# Patient Record
Sex: Female | Born: 1940 | Race: White | Hispanic: No | Smoking: Never smoker
Health system: Southern US, Community
[De-identification: ages and names within clinical notes are randomized; demographics above are authoritative.]

## PROBLEM LIST (undated history)

## (undated) DIAGNOSIS — R109 Unspecified abdominal pain: Secondary | ICD-10-CM

## (undated) DIAGNOSIS — M545 Low back pain: Secondary | ICD-10-CM

## (undated) DIAGNOSIS — R3989 Other symptoms and signs involving the genitourinary system: Secondary | ICD-10-CM

## (undated) DIAGNOSIS — N8111 Cystocele, midline: Secondary | ICD-10-CM

## (undated) DIAGNOSIS — H8110 Benign paroxysmal vertigo, unspecified ear: Secondary | ICD-10-CM

## (undated) DIAGNOSIS — M7540 Impingement syndrome of unspecified shoulder: Secondary | ICD-10-CM

## (undated) DIAGNOSIS — K219 Gastro-esophageal reflux disease without esophagitis: Secondary | ICD-10-CM

## (undated) DIAGNOSIS — IMO0002 Reserved for concepts with insufficient information to code with codable children: Secondary | ICD-10-CM

## (undated) DIAGNOSIS — H409 Unspecified glaucoma: Secondary | ICD-10-CM

## (undated) DIAGNOSIS — M1990 Unspecified osteoarthritis, unspecified site: Secondary | ICD-10-CM

## (undated) DIAGNOSIS — E7800 Pure hypercholesterolemia, unspecified: Secondary | ICD-10-CM

## (undated) DIAGNOSIS — N952 Postmenopausal atrophic vaginitis: Secondary | ICD-10-CM

## (undated) DIAGNOSIS — M7060 Trochanteric bursitis, unspecified hip: Secondary | ICD-10-CM

## (undated) DIAGNOSIS — C4359 Malignant melanoma of other part of trunk: Secondary | ICD-10-CM

## (undated) DIAGNOSIS — N3010 Interstitial cystitis (chronic) without hematuria: Secondary | ICD-10-CM

## (undated) DIAGNOSIS — I10 Essential (primary) hypertension: Secondary | ICD-10-CM

## (undated) HISTORY — DX: Malignant melanoma of other part of trunk: C43.59

## (undated) HISTORY — DX: Interstitial cystitis (chronic) without hematuria: N30.10

## (undated) HISTORY — DX: Reserved for concepts with insufficient information to code with codable children: IMO0002

## (undated) HISTORY — PX: MELANOMA EXCISION: SHX5266

## (undated) HISTORY — DX: Unspecified glaucoma: H40.9

## (undated) HISTORY — DX: Other symptoms and signs involving the genitourinary system: R39.89

## (undated) HISTORY — DX: Unspecified abdominal pain: R10.9

## (undated) HISTORY — DX: Cystocele, midline: N81.11

## (undated) HISTORY — DX: Essential (primary) hypertension: I10

## (undated) HISTORY — DX: Pure hypercholesterolemia, unspecified: E78.00

## (undated) HISTORY — DX: Low back pain: M54.5

## (undated) HISTORY — DX: Trochanteric bursitis, unspecified hip: M70.60

## (undated) HISTORY — DX: Benign paroxysmal vertigo, unspecified ear: H81.10

## (undated) HISTORY — DX: Postmenopausal atrophic vaginitis: N95.2

## (undated) HISTORY — PX: BREAST BIOPSY: SHX20

## (undated) HISTORY — DX: Impingement syndrome of unspecified shoulder: M75.40

## (undated) HISTORY — PX: RADICAL HYSTERECTOMY: SHX2283

---

## 2005-06-16 ENCOUNTER — Ambulatory Visit: Payer: Self-pay

## 2006-02-12 ENCOUNTER — Ambulatory Visit: Payer: Self-pay | Admitting: Gastroenterology

## 2006-12-22 ENCOUNTER — Ambulatory Visit: Payer: Self-pay | Admitting: Family Medicine

## 2007-12-02 ENCOUNTER — Ambulatory Visit: Payer: Self-pay | Admitting: Gastroenterology

## 2008-02-28 ENCOUNTER — Ambulatory Visit: Payer: Self-pay | Admitting: Family Medicine

## 2008-05-11 ENCOUNTER — Ambulatory Visit: Payer: Self-pay | Admitting: Family Medicine

## 2009-10-03 ENCOUNTER — Ambulatory Visit: Payer: Self-pay | Admitting: Gastroenterology

## 2009-10-31 ENCOUNTER — Ambulatory Visit: Payer: Self-pay | Admitting: Gastroenterology

## 2012-07-14 ENCOUNTER — Ambulatory Visit: Payer: Self-pay

## 2014-01-04 ENCOUNTER — Ambulatory Visit: Payer: Self-pay

## 2014-01-04 LAB — URINALYSIS, COMPLETE
BILIRUBIN, UR: NEGATIVE
Glucose,UR: NEGATIVE mg/dL (ref 0–75)
Ketone: NEGATIVE
Leukocyte Esterase: NEGATIVE
NITRITE: NEGATIVE
PROTEIN: NEGATIVE
Ph: 7 (ref 4.5–8.0)
SPECIFIC GRAVITY: 1.005 (ref 1.003–1.030)
WBC UR: NONE SEEN /HPF (ref 0–5)

## 2014-01-05 LAB — URINE CULTURE

## 2014-04-25 DIAGNOSIS — C4359 Malignant melanoma of other part of trunk: Secondary | ICD-10-CM

## 2014-04-25 HISTORY — DX: Malignant melanoma of other part of trunk: C43.59

## 2014-05-02 DIAGNOSIS — M545 Low back pain, unspecified: Secondary | ICD-10-CM | POA: Insufficient documentation

## 2014-05-02 DIAGNOSIS — M7060 Trochanteric bursitis, unspecified hip: Secondary | ICD-10-CM

## 2014-05-02 HISTORY — DX: Low back pain, unspecified: M54.50

## 2014-05-02 HISTORY — DX: Trochanteric bursitis, unspecified hip: M70.60

## 2014-07-11 DIAGNOSIS — K219 Gastro-esophageal reflux disease without esophagitis: Secondary | ICD-10-CM | POA: Insufficient documentation

## 2014-07-11 DIAGNOSIS — K222 Esophageal obstruction: Secondary | ICD-10-CM | POA: Insufficient documentation

## 2014-07-11 DIAGNOSIS — I10 Essential (primary) hypertension: Secondary | ICD-10-CM

## 2014-07-11 DIAGNOSIS — Q393 Congenital stenosis and stricture of esophagus: Secondary | ICD-10-CM | POA: Insufficient documentation

## 2014-07-11 DIAGNOSIS — H409 Unspecified glaucoma: Secondary | ICD-10-CM

## 2014-07-11 DIAGNOSIS — H8110 Benign paroxysmal vertigo, unspecified ear: Secondary | ICD-10-CM | POA: Insufficient documentation

## 2014-07-11 DIAGNOSIS — E7800 Pure hypercholesterolemia, unspecified: Secondary | ICD-10-CM

## 2014-07-11 HISTORY — DX: Essential (primary) hypertension: I10

## 2014-07-11 HISTORY — DX: Unspecified glaucoma: H40.9

## 2014-07-11 HISTORY — DX: Pure hypercholesterolemia, unspecified: E78.00

## 2014-07-11 HISTORY — DX: Benign paroxysmal vertigo, unspecified ear: H81.10

## 2014-07-12 ENCOUNTER — Ambulatory Visit: Payer: Self-pay | Admitting: Unknown Physician Specialty

## 2014-07-13 ENCOUNTER — Ambulatory Visit: Payer: Self-pay | Admitting: Unknown Physician Specialty

## 2014-10-18 DIAGNOSIS — M7540 Impingement syndrome of unspecified shoulder: Secondary | ICD-10-CM

## 2014-10-18 HISTORY — DX: Impingement syndrome of unspecified shoulder: M75.40

## 2015-02-21 DIAGNOSIS — M546 Pain in thoracic spine: Secondary | ICD-10-CM | POA: Insufficient documentation

## 2015-03-29 ENCOUNTER — Other Ambulatory Visit: Payer: Self-pay | Admitting: Unknown Physician Specialty

## 2015-03-29 DIAGNOSIS — M5414 Radiculopathy, thoracic region: Secondary | ICD-10-CM

## 2015-04-04 ENCOUNTER — Other Ambulatory Visit: Payer: Self-pay

## 2015-04-05 ENCOUNTER — Ambulatory Visit: Admission: RE | Admit: 2015-04-05 | Payer: Self-pay | Source: Ambulatory Visit

## 2015-06-14 ENCOUNTER — Telehealth: Payer: Self-pay | Admitting: Obstetrics and Gynecology

## 2015-06-14 NOTE — Telephone Encounter (Signed)
Patient called this morning and is inquiring about a new medication for memory loss (Prevagen).  She would like to know if you know anything about it and if it would hurt her kidneys as it contains a lot of protein.  Please advise and have one of the nurses give her a call.

## 2015-06-17 NOTE — Telephone Encounter (Signed)
Please notify patient that I am not familiar with this supplement.  From what I have read the FDA has not categorized it as a drug but there can be some serious side effects related to it's use.  I cannot make the recommendation for or against is given that it has not been approved by the FDA.  She can try and ask her PCP if they are more familiar with it.

## 2015-06-17 NOTE — Telephone Encounter (Signed)
Spoke with pt in reference to [place]. Nurse made pt aware [facility] is not aware of this drug and FDA has not approved it, therefore we can not give a medical recommendation. Nurse advised pt to call PCP. Pt voiced understanding stating she would give them a call.

## 2015-07-04 ENCOUNTER — Encounter: Payer: Self-pay | Admitting: Obstetrics and Gynecology

## 2015-07-04 ENCOUNTER — Ambulatory Visit (INDEPENDENT_AMBULATORY_CARE_PROVIDER_SITE_OTHER): Payer: Medicare Other | Admitting: Obstetrics and Gynecology

## 2015-07-04 VITALS — BP 143/74 | HR 72 | Ht 65.0 in | Wt 153.6 lb

## 2015-07-04 DIAGNOSIS — B3731 Acute candidiasis of vulva and vagina: Secondary | ICD-10-CM

## 2015-07-04 DIAGNOSIS — N3020 Other chronic cystitis without hematuria: Secondary | ICD-10-CM

## 2015-07-04 DIAGNOSIS — N952 Postmenopausal atrophic vaginitis: Secondary | ICD-10-CM

## 2015-07-04 DIAGNOSIS — N3010 Interstitial cystitis (chronic) without hematuria: Secondary | ICD-10-CM | POA: Diagnosis not present

## 2015-07-04 DIAGNOSIS — B373 Candidiasis of vulva and vagina: Secondary | ICD-10-CM | POA: Diagnosis not present

## 2015-07-04 DIAGNOSIS — N8111 Cystocele, midline: Secondary | ICD-10-CM

## 2015-07-04 DIAGNOSIS — R3989 Other symptoms and signs involving the genitourinary system: Secondary | ICD-10-CM | POA: Insufficient documentation

## 2015-07-04 DIAGNOSIS — R350 Frequency of micturition: Secondary | ICD-10-CM

## 2015-07-04 DIAGNOSIS — R109 Unspecified abdominal pain: Secondary | ICD-10-CM

## 2015-07-04 HISTORY — DX: Unspecified abdominal pain: R10.9

## 2015-07-04 HISTORY — DX: Cystocele, midline: N81.11

## 2015-07-04 LAB — URINALYSIS, COMPLETE
Bilirubin, UA: NEGATIVE
Glucose, UA: NEGATIVE
Leukocytes, UA: NEGATIVE
NITRITE UA: NEGATIVE
Protein, UA: NEGATIVE
RBC UA: NEGATIVE
SPEC GRAV UA: 1.025 (ref 1.005–1.030)
UUROB: 0.2 mg/dL (ref 0.2–1.0)
pH, UA: 5.5 (ref 5.0–7.5)

## 2015-07-04 LAB — BLADDER SCAN AMB NON-IMAGING: Scan Result: 0

## 2015-07-04 MED ORDER — NYSTATIN-TRIAMCINOLONE 100000-0.1 UNIT/GM-% EX OINT: 1.0000 "application " | TOPICAL_OINTMENT | Freq: Two times a day (BID) | CUTANEOUS | Status: DC

## 2015-07-04 MED ORDER — SODIUM BICARBONATE 8.4 % IV SOLN
11.0000 mL | Freq: Once | INTRAVENOUS | Status: AC
  Administered 2015-07-04: 11 mL

## 2015-07-04 NOTE — Progress Notes (Signed)
Bladder Rescue Solution Instillation  Due to IC patient is present today for a Rescue Solution Treatment.  Patient was cleaned and prepped in a sterile fashion with betadine and lidocaine 2% jelly was instilled into the urethra.  A 14 FR catheter was inserted, urine return was noted 12 ml, urine was yellow in color.  Instilled a solution consisting of 69ml of Sodium Bicarb, 2 ml Lidocaine and 1 ml of Heparin. The catheter was then removed. Patient tolerated well, no complications were noted.   Performed by: [place]  Follow up/ Additional Notes: 5 days for next Rescue solution treatment

## 2015-07-04 NOTE — Progress Notes (Signed)
07/04/2015 12:23 PM   [place] Pelto Lobato 1940/11/24 [id number]  Referring provider: No referring provider defined for this encounter.  Chief Complaint  Patient presents with  . Follow-up    89mth IC, chronic cystitis    HPI: 1.  Interstitial Cystitis-  Follow up- patient is a 74 year old female with a history of interstitial cystitis last visit 01/3015. She experiences mild dysuria and frequency with flares. She responds well to administration of rescue solutions and behavioral modifications. She has not required any treatment since her last visit. She states that she went to her PCP recently with complaint of frequency. Urine was checked and was found to be negative for infection. Her PCP recommended she follow up with [place] for another rescue solution.  She does have a history of vaginal atrophy and cystocele. She is currently using both oral and vaginal estrogen replacement.  Patient is complaining of mild external vaginal irritation today. No vaginal discharge.  PMH: Past Medical History  Diagnosis Date  . Chronic interstitial cystitis   . Vaginal atrophy   . Bladder pain   . Cystocele   . Abdominal pain   . Essential (primary) hypertension 07/11/2014  . Benign paroxysmal positional nystagmus 07/11/2014  . Bursitis, trochanteric 05/02/2014  . Cystocele, midline 07/04/2015  . AP (abdominal pain) 07/04/2015  . Glaucoma 07/11/2014  . Hypercholesteremia 07/11/2014  . Impingement syndrome of shoulder 10/18/2014  . LBP (low back pain) 05/02/2014  . 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 Breast biopsy    . Radical hysterectomy      Home Medications:    Medication List       This list is accurate as of: 07/04/15 12:23 PM.  Always use your most recent med list.               acetaminophen 325 MG tablet  Commonly known as:  TYLENOL  Take by mouth.     benzonatate 200 MG capsule  Commonly known as:   TESSALON  Take by mouth.     cetirizine 10 MG tablet  Commonly known as:  ZYRTEC  Take by mouth.     latanoprost 0.005 % ophthalmic solution  Commonly known as:  XALATAN  1 drop at bedtime.     lisinopril-hydrochlorothiazide 20-25 MG per tablet  Commonly known as:  PRINZIDE,ZESTORETIC  Take by mouth.     nystatin-triamcinolone ointment  Commonly known as:  MYCOLOG  Apply 1 application topically 2 (two) times daily.     Omega-3 1000 MG Caps  Take by mouth.     oxybutynin 5 MG 24 hr tablet  Commonly known as:  DITROPAN-XL  Take by mouth.     pantoprazole 20 MG tablet  Commonly known as:  PROTONIX  Take 20 mg by mouth daily.     PREMARIN vaginal cream  Generic drug:  conjugated estrogens  PLACE 0.5G VAGINALLY ONCE DAILY.     URIBEL 118 MG Caps  Take 1 capsule by mouth 4 (four) times daily as needed.        Allergies:  Allergies  Allergen Reactions  . Azo [Phenazopyridine] Itching  . Colesevelam Hcl Diarrhea  . Ezetimibe Diarrhea  . Lansoprazole Diarrhea  . Statins Other (See Comments)    diarrhea   . Ace Inhibitors Rash    Other Reaction: INSOMNIA  . Doxycycline Rash    Other Reaction: OTHER REACTION  . Estradiol Rash    Other Reaction:  OTHER REACTION  . Nsaids Rash    Other Reaction: GI UPSET  . Risedronate Sodium Rash    Other Reaction: OTHER REACTION    Family History: Family History  Problem Relation Age of Onset  . Kidney cancer Neg Hx   . Bladder Cancer Neg Hx     Social History:  reports that she has never smoked. She does not have any smokeless tobacco history on file. She reports that she does not drink alcohol or use illicit drugs.  ROS: UROLOGY Frequent Urination?: Yes Hard to postpone urination?: No Burning/pain with urination?: No Get up at night to urinate?: Yes Leakage of urine?: No Urine stream starts and stops?: No Trouble starting stream?: No Do you have to strain to urinate?: No Blood in urine?: No Urinary tract  infection?: No Sexually transmitted disease?: No Injury to kidneys or bladder?: No Weak stream?: No Currently pregnant?: No Vaginal bleeding?: No Last menstrual period?: 1966  Gastrointestinal Nausea?: No Vomiting?: No Indigestion/heartburn?: Yes Diarrhea?: No Constipation?: No  Constitutional Fever: No Night sweats?: No Weight loss?: No Fatigue?: No  Skin Skin rash/lesions?: No Itching?: No  Eyes Blurred vision?: No Double vision?: No  Ears/Nose/Throat Sore throat?: No Sinus problems?: No  Hematologic/Lymphatic Swollen glands?: No Easy bruising?: No  Cardiovascular Leg swelling?: No Chest pain?: No  Respiratory Cough?: No Shortness of breath?: No  Endocrine Excessive thirst?: No  Musculoskeletal Back pain?: Yes Joint pain?: No  Neurological Headaches?: No Dizziness?: No  Psychologic Depression?: No Anxiety?: No  Physical Exam: BP 143/74 mmHg  Pulse 72  Ht 5\' 5"  (1.651 m)  Wt 153 lb 9.6 oz (69.673 kg)  BMI 25.56 kg/m2  Constitutional:  Alert and oriented, No acute distress. HEENT: [place] AT, moist mucus membranes.  Trachea midline, no masses. Cardiovascular: No clubbing, cyanosis, or edema. Respiratory: Normal respiratory , no increased work of breathing. GI: Abdomen is soft, nontender, nondistended, no abdominal masses GU: No CVA tenderness. External vaginal exam remarkable for bilateral labia majora irritation and mild tenderness, small amount of white discharge noted on labia minora Skin: No rashes, bruises or suspicious lesions. Lymph: No cervical or inguinal adenopathy. Neurologic: Grossly intact, no focal deficits, moving all 4 extremities. Psychiatric: Normal mood and affect.  Laboratory Data: No results found for: WBC, HGB, HCT, MCV, PLT  No results found for: CREATININE  No results found for: PSA  No results found for: TESTOSTERONE  No results found for: HGBA1C  Urinalysis Results for orders placed or performed in visit on  07/04/15  Urinalysis, Complete  Result Value Ref Range   Specific Gravity, UA 1.025 1.005 - 1.030   pH, UA 5.5 5.0 - 7.5   Color, UA Yellow Yellow   Appearance Ur Clear Clear   Leukocytes, UA Negative Negative   Protein, UA Negative Negative/Trace   Glucose, UA Negative Negative   Ketones, UA Trace (A) Negative   RBC, UA Negative Negative   Bilirubin, UA Negative Negative   Urobilinogen, Ur 0.2 0.2 - 1.0 mg/dL   Nitrite, UA Negative Negative   Assessment & Plan:  74 year old female with interstitial cystitis with infrequent flares requiring administration of rescue solutions and behavior modifications which manages her symptoms well.  1. IC (interstitial cystitis)- Urine dip unremarkable today. Patient was unable to provide enough urine for microscopic examination. Rescue solution given today per patient request.  Will repeat twice next week and reevaluate. Patient encouraged to increase fluid intake. - Urinalysis, Complete  2. Cystocele, midline- Currently asymptomatic. PVR check today 85mL.  3. Vaginal atrophy- Continue oral and vaginal estrogen replacement.   4.  Yeast vaginitis-  External vaginal irritation consistent with yeast dermatitis. We will prescribe topical nystatin.  Return for follow up Tuesday and Friday of next week for Rescue solution.  Herbert Moors, [facility] [address] [place], [place] [address] [phone]

## 2015-07-04 NOTE — Progress Notes (Signed)
PUFF:  How many times do you go to the bathroom during the day?- 11-14 How many times do you go to the bathroom at night? 3 If you get up at night to go to the bathroom, does it bother you? Mildly Are you currently sexually active? Yes Any pain with the sexual intercourse? Occasionally

## 2015-07-05 ENCOUNTER — Telehealth: Payer: Self-pay

## 2015-07-05 NOTE — Telephone Encounter (Signed)
Yes please prescribe just regular nystatin cream apply 2-3 times daily to external vagina 1 week.

## 2015-07-05 NOTE — Telephone Encounter (Signed)
Pt called stating cream that was called into [facility] pharmacy is $200. Pt states can not afford that. Is there a different cream? Please advise.

## 2015-07-09 ENCOUNTER — Ambulatory Visit (INDEPENDENT_AMBULATORY_CARE_PROVIDER_SITE_OTHER): Payer: Medicare Other | Admitting: Obstetrics and Gynecology

## 2015-07-09 ENCOUNTER — Encounter: Payer: Self-pay | Admitting: Obstetrics and Gynecology

## 2015-07-09 VITALS — BP 143/78 | HR 69 | Resp 16 | Ht 65.0 in | Wt 173.4 lb

## 2015-07-09 DIAGNOSIS — B373 Candidiasis of vulva and vagina: Secondary | ICD-10-CM

## 2015-07-09 DIAGNOSIS — N952 Postmenopausal atrophic vaginitis: Secondary | ICD-10-CM

## 2015-07-09 DIAGNOSIS — B3731 Acute candidiasis of vulva and vagina: Secondary | ICD-10-CM

## 2015-07-09 DIAGNOSIS — N3010 Interstitial cystitis (chronic) without hematuria: Secondary | ICD-10-CM | POA: Diagnosis not present

## 2015-07-09 LAB — MICROSCOPIC EXAMINATION: Bacteria, UA: NONE SEEN

## 2015-07-09 LAB — URINALYSIS, COMPLETE
BILIRUBIN UA: NEGATIVE
GLUCOSE, UA: NEGATIVE
KETONES UA: NEGATIVE
LEUKOCYTES UA: NEGATIVE
Nitrite, UA: NEGATIVE
PROTEIN UA: NEGATIVE
RBC UA: NEGATIVE
SPEC GRAV UA: 1.015 (ref 1.005–1.030)
Urobilinogen, Ur: 0.2 mg/dL (ref 0.2–1.0)
pH, UA: 7 (ref 5.0–7.5)

## 2015-07-09 MED ORDER — SODIUM BICARBONATE 8.4 % IV SOLN
11.0000 mL | Freq: Once | INTRAVENOUS | Status: AC
  Administered 2015-07-09: 11 mL

## 2015-07-09 MED ORDER — FLUCONAZOLE 150 MG PO TABS: 150.0000 mg | ORAL_TABLET | Freq: Once | ORAL | Status: DC

## 2015-07-09 MED ORDER — NYSTATIN 100000 UNIT/GM EX CREA: 1.0000 "application " | TOPICAL_CREAM | Freq: Two times a day (BID) | CUTANEOUS | Status: DC

## 2015-07-09 MED ORDER — LIDOCAINE HCL 2 % EX GEL
1.0000 "application " | Freq: Once | CUTANEOUS | Status: AC
  Administered 2015-07-09: 1 via URETHRAL

## 2015-07-09 NOTE — Telephone Encounter (Signed)
Patient notified, patient has already picked up cream from pharmacy

## 2015-07-09 NOTE — Progress Notes (Signed)
07/09/2015 9:32 AM   Sara Jackson [birth year] [id number]  Referring provider: Sherrin Daisy, MD [address] [place], [place] [address]  Chief Complaint  Patient presents with  . Cystitis    Rescue solution    HPI: Interstitial Cystitis- Follow up- Patient is a 74 year old female with a history of interstitial cystitis last visit 07/04/15. She experiences mild dysuria and frequency with flares. She responds well to administration of rescue solutions and behavioral modifications.  She states that she went to her PCP recently with complaint of frequency. Urine was checked and was found to be negative for infection. Her PCP recommended she follow up with [place] for another rescue solution.  Patient presents today for 2 of 3 planned rescue solutions today.  She states that last solution improved her symptoms slightly but that she is still experiencing vaginal discomfort because she was unable to afford Mycolog cream prescribed last visit.  She does have a history of vaginal atrophy and cystocele. She is currently using both oral and vaginal estrogen replacement.   PMH: Past Medical History  Diagnosis Date  . Chronic interstitial cystitis   . Vaginal atrophy   . Bladder pain   . Cystocele   . Abdominal pain   . Essential (primary) hypertension 07/11/2014  . Benign paroxysmal positional nystagmus 07/11/2014  . Bursitis, trochanteric 05/02/2014  . Cystocele, midline 07/04/2015  . AP (abdominal pain) 07/04/2015  . Glaucoma 07/11/2014  . Hypercholesteremia 07/11/2014  . Impingement syndrome of shoulder 10/18/2014  . LBP (low back pain) 05/02/2014  . 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 Breast biopsy    . Radical hysterectomy      Home Medications:    Medication List       This list is accurate as of: 07/09/15  9:32 AM.  Always use your most recent med list.               acetaminophen 325 MG  tablet  Commonly known as:  TYLENOL  Take by mouth.     fluconazole 150 MG tablet  Commonly known as:  DIFLUCAN  Take 1 tablet (150 mg total) by mouth once.     latanoprost 0.005 % ophthalmic solution  Commonly known as:  XALATAN  1 drop at bedtime.     lisinopril-hydrochlorothiazide 20-25 MG per tablet  Commonly known as:  PRINZIDE,ZESTORETIC  Take by mouth.     nystatin cream  Commonly known as:  MYCOSTATIN  Apply 1 application topically 2 (two) times daily.     nystatin-triamcinolone ointment  Commonly known as:  MYCOLOG  Apply 1 application topically 2 (two) times daily.     PREMARIN vaginal cream  Generic drug:  conjugated estrogens  PLACE 0.5G VAGINALLY ONCE DAILY.     URIBEL 118 MG Caps  Take 1 capsule by mouth 4 (four) times daily as needed.        Allergies:  Allergies  Allergen Reactions  . Azo [Phenazopyridine] Itching  . Colesevelam Hcl Diarrhea  . Ezetimibe Diarrhea  . Lansoprazole Diarrhea  . Statins Other (See Comments)    diarrhea   . Ace Inhibitors Rash    Other Reaction: INSOMNIA  . Doxycycline Rash    Other Reaction: OTHER REACTION  . Estradiol Rash    Other Reaction: OTHER REACTION  . Nsaids Rash    Other Reaction: GI UPSET  . Risedronate Sodium Rash    Other Reaction: OTHER REACTION  Family History: Family History  Problem Relation Age of Onset  . Kidney cancer Neg Hx   . Bladder Cancer Neg Hx     Social History:  reports that she has never smoked. She does not have any smokeless tobacco history on file. She reports that she does not drink alcohol or use illicit drugs.  ROS: Review of systems: A 10 point comprehensive review of systems was obtained and is negative unless otherwise stated in the history of present illness.  Physical Exam: BP 143/78 mmHg  Pulse 69  Resp 16  Ht 5\' 5"  (1.651 m)  Wt 173 lb 6.4 oz (78.654 kg)  BMI 28.86 kg/m2  Constitutional:  Alert and oriented, No acute distress. HEENT: [place] AT, moist mucus  membranes.  Trachea midline, no masses. Cardiovascular: No clubbing, cyanosis, or edema. Respiratory: Normal respiratory effort, no increased work of breathing. GI: Abdomen is soft, nontender, nondistended, no abdominal masses GU: No CVA tenderness. Skin: No rashes, bruises or suspicious lesions. Lymph: No cervical adenopathy. Neurologic: Grossly intact, no focal deficits, moving all 4 extremities. Psychiatric: Normal mood and affect.  Assessment & Plan:  1. Interstitial cystitis Microscopic UA unremarkable today. 2 of 3 rescue solution administered.  Patient to follow up in 2 days for 3rd.   - Urinalysis, Complete  2. Yeast Vaginitis- Nystatin cream and Diflucan prescribed today.  3. Vaginal Atrophy-  Continue Estrogen replacement. Problem List Items Addressed This Visit      Genitourinary   Vaginal atrophy   Yeast vaginitis   Relevant Medications   nystatin cream (MYCOSTATIN)   fluconazole (DIFLUCAN) 150 MG tablet    Other Visit Diagnoses    Interstitial cystitis    -  Primary    Relevant Medications    heparin-lidocaine-sod bicarb bladder irrigation (Bladder rescue for bladder instillation procedures) (Completed)    lidocaine (XYLOCAINE) 2 % jelly 1 application (Completed)    heparin-lidocaine-sod bicarb bladder irrigation (Bladder rescue for bladder instillation procedures) (Start on 07/09/2015  9:45 AM)    Other Relevant Orders    Urinalysis, Complete      Return in about 3 days (around 07/12/2015).  Herbert Moors, [facility] [address], [place], [place] [address] [phone]

## 2015-07-09 NOTE — Progress Notes (Signed)
Bladder Rescue Solution Instillation  Due to interstitial cystitis patient is present today for a Rescue Solution Treatment.  Patient was cleaned and prepped in a sterile fashion with betadine and lidocaine 2% jelly was instilled into the urethra.  A  14FR catheter was inserted, urine return was not noted due to complete void upon checking UA.  Instilled a solution consisting of 66ml of Sodium Bicarb, 2 ml Lidocaine and 1 ml of Heparin. The catheter was then removed. Patient tolerated well, no complications were noted.   Performed by: Lyndee Hensen CMA

## 2015-07-12 ENCOUNTER — Encounter: Payer: Self-pay | Admitting: *Deleted

## 2015-07-12 ENCOUNTER — Ambulatory Visit (INDEPENDENT_AMBULATORY_CARE_PROVIDER_SITE_OTHER): Payer: Medicare Other | Admitting: Obstetrics and Gynecology

## 2015-07-12 DIAGNOSIS — N3010 Interstitial cystitis (chronic) without hematuria: Secondary | ICD-10-CM | POA: Diagnosis not present

## 2015-07-12 DIAGNOSIS — N952 Postmenopausal atrophic vaginitis: Secondary | ICD-10-CM

## 2015-07-12 DIAGNOSIS — B3731 Acute candidiasis of vulva and vagina: Secondary | ICD-10-CM

## 2015-07-12 DIAGNOSIS — B373 Candidiasis of vulva and vagina: Secondary | ICD-10-CM

## 2015-07-12 LAB — URINALYSIS, COMPLETE
BILIRUBIN UA: NEGATIVE
GLUCOSE, UA: NEGATIVE
KETONES UA: NEGATIVE
Leukocytes, UA: NEGATIVE
Nitrite, UA: NEGATIVE
PROTEIN UA: NEGATIVE
RBC UA: NEGATIVE
SPEC GRAV UA: 1.02 (ref 1.005–1.030)
Urobilinogen, Ur: 0.2 mg/dL (ref 0.2–1.0)
pH, UA: 6 (ref 5.0–7.5)

## 2015-07-12 LAB — MICROSCOPIC EXAMINATION
EPITHELIAL CELLS (NON RENAL): NONE SEEN /HPF (ref 0–10)
RBC, UA: NONE SEEN /hpf (ref 0–?)

## 2015-07-12 MED ORDER — LIDOCAINE HCL 2 % EX GEL
1.0000 "application " | Freq: Once | CUTANEOUS | Status: AC
  Administered 2015-07-12: 1 via URETHRAL

## 2015-07-12 MED ORDER — SODIUM BICARBONATE 8.4 % IV SOLN
11.0000 mL | Freq: Once | INTRAVENOUS | Status: AC
  Administered 2015-07-12: 11 mL

## 2015-07-12 NOTE — Progress Notes (Signed)
Erroneous entry

## 2015-07-12 NOTE — Progress Notes (Signed)
Bladder Rescue Solution Instillation  Due to interstitial cystitis patient is present today for a Rescue Solution Treatment.  Patient was cleaned and prepped in a sterile fashion with betadine and lidocaine 2% jelly was instilled into the urethra.  A 14 FR catheter was inserted, urine return was noted 181ml, urine was clear in color.  Instilled a solution consisting of 79ml of Sodium Bicarb, 2 ml Lidocaine and 1 ml of Heparin. The catheter was then removed. Patient tolerated well, no complications were noted.   Performed by: Gwenevere Abbot CMA

## 2015-07-26 ENCOUNTER — Other Ambulatory Visit: Payer: Self-pay | Admitting: Obstetrics and Gynecology

## 2015-07-27 ENCOUNTER — Ambulatory Visit
Admission: EM | Admit: 2015-07-27 | Discharge: 2015-07-27 | Disposition: A | Discharge status: Home / Self Care | Payer: Medicare Other | Attending: Family Medicine | Admitting: Family Medicine

## 2015-07-27 ENCOUNTER — Encounter: Payer: Self-pay | Admitting: Gynecology

## 2015-07-27 DIAGNOSIS — I10 Essential (primary) hypertension: Secondary | ICD-10-CM | POA: Insufficient documentation

## 2015-07-27 DIAGNOSIS — R8290 Unspecified abnormal findings in urine: Secondary | ICD-10-CM | POA: Insufficient documentation

## 2015-07-27 LAB — URINALYSIS COMPLETE WITH MICROSCOPIC (ARMC ONLY)
Bilirubin Urine: NEGATIVE
Glucose, UA: NEGATIVE mg/dL
Ketones, ur: NEGATIVE mg/dL
LEUKOCYTES UA: NEGATIVE
NITRITE: NEGATIVE
PH: 7 (ref 5.0–8.0)
PROTEIN: NEGATIVE mg/dL
Specific Gravity, Urine: 1.015 (ref 1.005–1.030)
WBC UA: NONE SEEN WBC/hpf (ref <3)

## 2015-07-27 MED ORDER — CLONIDINE HCL 0.1 MG PO TABS
0.1000 mg | ORAL_TABLET | Freq: Every day | ORAL | Status: DC
  Administered 2015-07-27: 0.1 mg via ORAL

## 2015-07-27 NOTE — ED Notes (Signed)
Pt. Changed BP meds on 9/16 b/c BP was improving.  9/23 noticed elevated BP and noticed fluid retention and headache (in back on head), and describes "feeling funny".  Pt. Also states prone to UTI.

## 2015-07-27 NOTE — Discharge Instructions (Signed)
Hypertension °Hypertension, commonly called high blood pressure, is when the force of blood pumping through your arteries is too strong. Your arteries are the blood vessels that carry blood from your heart throughout your body. A blood pressure reading consists of a higher number over a lower number, such as 110/72. The higher number (systolic) is the pressure inside your arteries when your heart pumps. The lower number (diastolic) is the pressure inside your arteries when your heart relaxes. Ideally you want your blood pressure below 120/80. °Hypertension forces your heart to work harder to pump blood. Your arteries may become narrow or stiff. Having hypertension puts you at risk for heart disease, stroke, and other problems.  °RISK FACTORS °Some risk factors for high blood pressure are controllable. Others are not.  °Risk factors you cannot control include:  °· Race. You may be at higher risk if you are African American. °· Age. Risk increases with age. °· Gender. Men are at higher risk than women before age 45 years. After age 65, women are at higher risk than men. °Risk factors you can control include: °· Not getting enough exercise or physical activity. °· Being overweight. °· Getting too much fat, sugar, calories, or salt in your diet. °· Drinking too much alcohol. °SIGNS AND SYMPTOMS °Hypertension does not usually cause signs or symptoms. Extremely high blood pressure (hypertensive crisis) may cause headache, anxiety, shortness of breath, and nosebleed. °DIAGNOSIS  °To check if you have hypertension, your health care provider will measure your blood pressure while you are seated, with your arm held at the level of your heart. It should be measured at least twice using the same arm. Certain conditions can cause a difference in blood pressure between your right and left arms. A blood pressure reading that is higher than normal on one occasion does not mean that you need treatment. If one blood pressure reading  is high, ask your health care provider about having it checked again. °TREATMENT  °Treating high blood pressure includes making lifestyle changes and possibly taking medicine. Living a healthy lifestyle can help lower high blood pressure. You may need to change some of your habits. °Lifestyle changes may include: °· Following the DASH diet. This diet is high in fruits, vegetables, and whole grains. It is low in salt, red meat, and added sugars. °· Getting at least 2½ hours of brisk physical activity every week. °· Losing weight if necessary. °· Not smoking. °· Limiting alcoholic beverages. °· Learning ways to reduce stress. ° If lifestyle changes are not enough to get your blood pressure under control, your health care provider may prescribe medicine. You may need to take more than one. Work closely with your health care provider to understand the risks and benefits. °HOME CARE INSTRUCTIONS °· Have your blood pressure rechecked as directed by your health care provider.   °· Take medicines only as directed by your health care provider. Follow the directions carefully. Blood pressure medicines must be taken as prescribed. The medicine does not work as well when you skip doses. Skipping doses also puts you at risk for problems.   °· Do not smoke.   °· Monitor your blood pressure at home as directed by your health care provider.  °SEEK MEDICAL CARE IF:  °· You think you are having a reaction to medicines taken. °· You have recurrent headaches or feel dizzy. °· You have swelling in your ankles. °· You have trouble with your vision. °SEEK IMMEDIATE MEDICAL CARE IF: °· You develop a severe headache or confusion. °·   You have unusual weakness, numbness, or feel faint.  You have severe chest or abdominal pain.  You vomit repeatedly.  You have trouble breathing. MAKE SURE YOU:   Understand these instructions.  Will watch your condition.  Will get help right away if you are not doing well or get worse. Document  Released: 10/19/2005 Document Revised: 03/05/2014 Document Reviewed: 08/11/2013 [facility] Patient Information 2015 [place], [place]. This information is not intended to replace advice given to you by your health care provider. Make sure you discuss any questions you have with your health care provider.  Follow up with PCP on Monday

## 2015-07-27 NOTE — ED Provider Notes (Signed)
CSN: [id number]     Arrival date & time 07/27/15  0802 History   First MD Initiated Contact with Patient 07/27/15 [phone]     Chief Complaint  Patient presents with  . Hypertension  . Urinary Tract Infection   (Consider location/radiation/quality/duration/timing/severity/associated sxs/prior Treatment) HPI Comments: 74 yo female here with a concern of high blood pressure. States her hypertension medication was recently adjusted (dose reduced due to readings) but since yesterday her blood pressure has been high and has had a headache. Denies any chest pains or shortness of breath. Also would like to have urine checked for UTI as she's had them frequently.   Patient is a 74 y.o. female presenting with hypertension and urinary tract infection. The history is provided by the patient.  Hypertension This is a new problem.  Urinary Tract Infection   Past Medical History  Diagnosis Date  . Chronic interstitial cystitis   . Vaginal atrophy   . Bladder pain   . Cystocele   . Abdominal pain   . Essential (primary) hypertension 07/11/2014  . Benign paroxysmal positional nystagmus 07/11/2014  . Bursitis, trochanteric 05/02/2014  . Cystocele, midline 07/04/2015  . AP (abdominal pain) 07/04/2015  . Glaucoma 07/11/2014  . Hypercholesteremia 07/11/2014  . Impingement syndrome of shoulder 10/18/2014  . LBP (low back pain) 05/02/2014  . 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 Breast biopsy Left   . Radical hysterectomy     Family History  Problem Relation Age of Onset  . Kidney cancer Neg Hx   . Bladder Cancer Neg Hx    Social History  Substance Use Topics  . Smoking status: Never Smoker   . Smokeless tobacco: None  . Alcohol Use: No   OB History    No data available     Review of Systems  Allergies  Azo; Colesevelam hcl; Ezetimibe; Lansoprazole; Statins; Ace inhibitors; Doxycycline; Estradiol; Nsaids; and Risedronate sodium  Home  Medications   Prior to Admission medications   Medication Sig Start Date End Date Taking? Authorizing Provider  acetaminophen (TYLENOL) 325 MG tablet Take by mouth. 07/11/14  Yes Historical Provider, MD  conjugated estrogens (PREMARIN) vaginal cream PLACE 0.5G VAGINALLY ONCE DAILY. 11/26/14  Yes Historical Provider, MD  latanoprost (XALATAN) 0.005 % ophthalmic solution 1 drop at bedtime.   Yes Historical Provider, MD  lisinopril (PRINIVIL,ZESTRIL) 10 MG tablet Take 10 mg by mouth daily.   Yes Historical Provider, MD  fluconazole (DIFLUCAN) 150 MG tablet Take 1 tablet (150 mg total) by mouth once. 07/09/15   Roda Shutters, FNP  lisinopril-hydrochlorothiazide (PRINZIDE,ZESTORETIC) 20-25 MG per tablet Take by mouth. 07/11/14   Historical Provider, MD  Meth-Hyo-M Barnett Hatter Phos-Ph Sal (URIBEL) 118 MG CAPS Take 1 capsule by mouth 4 (four) times daily as needed.    Historical Provider, MD  nystatin cream (MYCOSTATIN) Apply 1 application topically 2 (two) times daily. 07/09/15   Roda Shutters, FNP  nystatin-triamcinolone ointment [facility]) Apply 1 application topically 2 (two) times daily. 07/04/15   Roda Shutters, FNP   Meds Ordered and Administered this Visit   Medications - No data to display  BP 183/80 mmHg  Pulse 88  Temp(Src) 97.9 F (36.6 C) (Oral)  Ht 5\' 5"  (1.651 m)  Wt 157 lb 3.2 oz (71.305 kg)  BMI 26.16 kg/m2  SpO2 97% No data found.   Physical Exam  Constitutional: She is oriented to person, place, and time. She appears well-developed and well-nourished.  No distress.  Cardiovascular: Normal rate, normal heart sounds and intact distal pulses.   No murmur heard. Pulmonary/Chest: Effort normal and breath sounds normal. No respiratory distress. She has no wheezes. She has no rales. She exhibits no tenderness.  Abdominal: Soft. Bowel sounds are normal. She exhibits no distension and no mass. There is no tenderness. There is no rebound and no guarding.  Neurological: She is oriented  to person, place, and time. No cranial nerve deficit.  Skin: No rash noted. She is not diaphoretic.  Nursing note and vitals reviewed.   ED Course  Procedures (including critical care time)  Labs Review Labs Reviewed  URINALYSIS COMPLETEWITH MICROSCOPIC ([place] ONLY) - Abnormal; Notable for the following:    Color, Urine STRAW (*)    Hgb urine dipstick TRACE (*)    Squamous Epithelial / LPF 6-30 (*)    All other components within normal limits  URINE CULTURE    Imaging Review No results found.   Visual Acuity Review  Right Eye Distance:   Left Eye Distance:   Bilateral Distance:    Right Eye Near:   Left Eye Near:    Bilateral Near:         MDM   1. Essential hypertension    Discharge Medication List as of 07/27/2015  9:32 AM    Plan: 1.  UA results reviewed and diagnosis reviewed with patient 2. Patient given clonidine 0.1mg  po x 1 in clinic with improvement of bp; recheck manual bp: 150/80 3. Recommend increase lisinopril from 10mg  qd to 20mg  qd today and tomorrow (Sunday) 4. F/u with PCP on Monday for further recommendation on her HTN treatment/medications  Norval Gable, MD 07/27/15 [phone]

## 2015-07-29 LAB — URINE CULTURE

## 2015-08-02 ENCOUNTER — Ambulatory Visit: Payer: Self-pay

## 2015-08-02 ENCOUNTER — Telehealth: Payer: Self-pay

## 2015-08-02 NOTE — Telephone Encounter (Signed)
Pt pharmacy sent a refill request for nystatin cream. Please advise.  

## 2015-08-05 NOTE — Telephone Encounter (Signed)
Sometimes these are sent automatically. Using call the patient and see if she actually needs a refill. If she does be consented into the pharmacy. Thanks

## 2015-08-07 NOTE — Telephone Encounter (Signed)
Spoke with patient and she states she can get by for now and she will call office if and when she needs more.

## 2015-08-13 ENCOUNTER — Telehealth: Payer: Self-pay

## 2015-08-13 NOTE — Telephone Encounter (Signed)
Pt pharmacy sent a refill request for nystatin cream. Nurse spoke with pt who stated at this point in time she does not need the cream. Nurse reinforced with pt that when she does need more she will need a f/u appt. Pt voiced understanding.

## 2015-10-03 ENCOUNTER — Other Ambulatory Visit: Payer: Self-pay | Admitting: Otolaryngology

## 2015-10-03 DIAGNOSIS — R42 Dizziness and giddiness: Secondary | ICD-10-CM

## 2015-10-23 ENCOUNTER — Ambulatory Visit
Admission: RE | Admit: 2015-10-23 | Discharge: 2015-10-23 | Disposition: A | Discharge status: Home / Self Care | Payer: Medicare Other | Source: Ambulatory Visit | Attending: Otolaryngology | Admitting: Otolaryngology

## 2015-10-23 DIAGNOSIS — R42 Dizziness and giddiness: Secondary | ICD-10-CM | POA: Diagnosis present

## 2015-10-23 DIAGNOSIS — I6529 Occlusion and stenosis of unspecified carotid artery: Secondary | ICD-10-CM | POA: Diagnosis not present

## 2015-10-23 MED ORDER — GADOBENATE DIMEGLUMINE 529 MG/ML IV SOLN
15.0000 mL | Freq: Once | INTRAVENOUS | Status: AC | PRN
  Administered 2015-10-23: 14 mL via INTRAVENOUS

## 2015-10-30 ENCOUNTER — Encounter: Payer: Self-pay | Admitting: Obstetrics and Gynecology

## 2015-10-30 ENCOUNTER — Ambulatory Visit (INDEPENDENT_AMBULATORY_CARE_PROVIDER_SITE_OTHER): Payer: Medicare Other | Admitting: Obstetrics and Gynecology

## 2015-10-30 VITALS — BP 163/86 | HR 78 | Resp 16 | Ht 65.0 in | Wt 153.6 lb

## 2015-10-30 DIAGNOSIS — N3090 Cystitis, unspecified without hematuria: Secondary | ICD-10-CM

## 2015-10-30 LAB — MICROSCOPIC EXAMINATION
RBC MICROSCOPIC, UA: NONE SEEN /HPF (ref 0–?)
Renal Epithel, UA: NONE SEEN /hpf

## 2015-10-30 LAB — URINALYSIS, COMPLETE
Bilirubin, UA: NEGATIVE
GLUCOSE, UA: NEGATIVE
KETONES UA: NEGATIVE
Leukocytes, UA: NEGATIVE
NITRITE UA: POSITIVE — AB
PROTEIN UA: NEGATIVE
RBC, UA: NEGATIVE
SPEC GRAV UA: 1.015 (ref 1.005–1.030)
UUROB: 0.2 mg/dL (ref 0.2–1.0)
pH, UA: 6 (ref 5.0–7.5)

## 2015-10-30 MED ORDER — SODIUM BICARBONATE 8.4 % IV SOLN: 11.0000 mL | Freq: Once | INTRAVENOUS | Status: DC

## 2015-10-30 NOTE — Patient Instructions (Signed)
Uribel Samples- take 1 tablet up to 4 times daily with a large glass of water

## 2015-10-30 NOTE — Progress Notes (Signed)
3:49 PM   Sara Jackson 27-Aug-1941 [id number]  Referring provider: Sherrin Daisy, MD [place], [place] [id number]  Chief Complaint  Patient presents with  . Cystitis    Rescue solution    HPI: Interstitial Cystitis- Follow up- Patient is a 74 year old female with a history of interstitial cystitis last visit 07/04/15. She experiences mild dysuria and frequency with flares. She responds well to administration of rescue solutions and behavioral modifications.  She states that she went to her PCP recently with complaint of frequency. Urine was checked and was found to be negative for infection. Her PCP recommended she follow up with [place] for another rescue solution.  Patient presents today for 2 of 3 planned rescue solutions today.  She states that last solution improved her symptoms slightly but that she is still experiencing vaginal discomfort because she was unable to afford Mycolog cream prescribed last visit.  She does have a history of vaginal atrophy and cystocele. She is currently using both oral and vaginal estrogen replacement.   Current Status: Patient reports increased urinary frequency, left lower quadrant and suprapubic discomfort over the last few days.   No fevers, nausea or vomiting. No gross hematuria.  Took OTC AZO which helped some. No exacerbating or alleviating factors.  PMH: Past Medical History  Diagnosis Date  . Chronic interstitial cystitis   . Vaginal atrophy   . Bladder pain   . Cystocele   . Abdominal pain   . Essential (primary) hypertension 07/11/2014  . Benign paroxysmal positional nystagmus 07/11/2014  . Bursitis, trochanteric 05/02/2014  . Cystocele, midline 07/04/2015  . AP (abdominal pain) 07/04/2015  . Glaucoma 07/11/2014  . Hypercholesteremia 07/11/2014  . Impingement syndrome of shoulder 10/18/2014  . LBP (low back pain) 05/02/2014  . 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[place]) 04/25/2014    Surgical History: Past Surgical History    Procedure Laterality Date  . Breast biopsy Left   . Radical hysterectomy      Home Medications:    Medication List       This list is accurate as of: 10/30/15  3:49 PM.  Always use your most recent med list.               acetaminophen 325 MG tablet  Commonly known as:  TYLENOL  Take by mouth.     fluconazole 150 MG tablet  Commonly known as:  DIFLUCAN  Take 1 tablet (150 mg total) by mouth once.     latanoprost 0.005 % ophthalmic solution  Commonly known as:  XALATAN  1 drop at bedtime.     lisinopril 10 MG tablet  Commonly known as:  PRINIVIL,ZESTRIL  Take 10 mg by mouth daily.     lisinopril-hydrochlorothiazide 20-25 MG tablet  Commonly known as:  PRINZIDE,ZESTORETIC  Take by mouth.     meclizine 25 MG tablet  Commonly known as:  ANTIVERT  TAKE 1 TABLET BY MOUTH THREE TIMES DAILY AS NEEDED FOR DIZZINESS     nystatin cream  Commonly known as:  MYCOSTATIN  Apply 1 application topically 2 (two) times daily.     nystatin-triamcinolone ointment  Commonly known as:  MYCOLOG  Apply 1 application topically 2 (two) times daily.     PREMARIN vaginal cream  Generic drug:  conjugated estrogens  PLACE 0.5G VAGINALLY ONCE DAILY.     URIBEL 118 MG Caps  Take 1 capsule by mouth 4 (four) times daily as needed.        Allergies:  Allergies  Allergen Reactions  . Azo [Phenazopyridine] Itching  . Colesevelam Hcl Diarrhea  . Ezetimibe Diarrhea  . Fluoxetine Other (See Comments)    Other Reaction: INSOMNIA  . Lansoprazole Diarrhea  . Statins Other (See Comments)    diarrhea   . Ace Inhibitors Rash    Other Reaction: INSOMNIA  . Doxycycline Rash    Other Reaction: OTHER REACTION  . Estradiol Rash    Other Reaction: OTHER REACTION  . Nsaids Rash    Other Reaction: GI UPSET  . Risedronate Sodium Rash    Other Reaction: OTHER REACTION    Family History: Family History  Problem Relation Age of Onset  . Kidney cancer Neg Hx   . Bladder Cancer Neg Hx      Social History:  reports that she has never smoked. She does not have any smokeless tobacco history on file. She reports that she does not drink alcohol or use illicit drugs.  ROS: Review of systems: A 10 point comprehensive review of systems was obtained and is negative unless otherwise stated in the history of present illness.  Physical Exam: BP 163/86 mmHg  Pulse 78  Resp 16  Ht 5\' 5"  (1.651 m)  Wt 153 lb 9.6 oz (69.673 kg)  BMI 25.56 kg/m2  Constitutional:  Alert and oriented, No acute distress. HEENT: [place] AT, moist mucus membranes.  Trachea midline, no masses. Cardiovascular: No clubbing, cyanosis, or edema. Respiratory: Normal respiratory effort, no increased work of breathing. GI: Abdomen is soft, mild LLQ/suprapubic tenderness, nondistended, no abdominal masses GU: No CVA tenderness. Skin: No rashes, bruises or suspicious lesions. Lymph: No cervical adenopathy. Neurologic: Grossly intact, no focal deficits, moving all 4 extremities. Psychiatric: Normal mood and affect.  Assessment & Plan:  1. Interstitial cystitis Microscopic UA Nitrite positive but otherwise unremarkable. Possible UTI.  Will treat pending culture results. Patient provided samples of Uribel for discomfort.   - Urinalysis, Complete  2. Vaginal Atrophy-  Continue Estrogen replacement.  No Follow-up on file.  Herbert Moors, [facility] [address], [place], [place] [address] [phone]

## 2015-11-03 LAB — CULTURE, URINE COMPREHENSIVE

## 2015-11-05 ENCOUNTER — Ambulatory Visit: Payer: No Typology Code available for payment source | Admitting: Obstetrics and Gynecology

## 2015-11-05 ENCOUNTER — Telehealth: Payer: Self-pay

## 2015-11-05 ENCOUNTER — Telehealth: Payer: Self-pay | Admitting: Obstetrics and Gynecology

## 2015-11-05 NOTE — Telephone Encounter (Signed)
Spoke with pt in reference to -ucx. Pt voiced understanding.  

## 2015-11-05 NOTE — Telephone Encounter (Signed)
See previous note

## 2015-11-05 NOTE — Telephone Encounter (Signed)
Pt called asking for her urine culture results, please call.  If medicine is needed, please send to [facility] in [place].

## 2015-11-05 NOTE — Telephone Encounter (Signed)
-----   Message from Roda Shutters, [place] sent at 11/05/2015  1:00 PM EST ----- Please notify patient that her urine culture was negative for infection.

## 2015-11-19 ENCOUNTER — Telehealth: Payer: Self-pay | Admitting: Obstetrics and Gynecology

## 2015-11-19 DIAGNOSIS — R300 Dysuria: Secondary | ICD-10-CM

## 2015-11-19 NOTE — Telephone Encounter (Signed)
Pt was gvien samples of Uribel and states that this medicine is helping her.  She wants to have a prescription called in to her pharmacy, [facility] in [place].  Please contact pt and let her know if a prescription can be sent in.

## 2015-11-20 MED ORDER — URIBEL 118 MG PO CAPS: 1.0000 | ORAL_CAPSULE | Freq: Four times a day (QID) | ORAL | Status: DC | PRN

## 2015-11-20 NOTE — Telephone Encounter (Signed)
Medication sent to pt pharmacy 

## 2015-11-20 NOTE — Telephone Encounter (Signed)
Please send in a prescription.  thanks

## 2016-01-17 ENCOUNTER — Other Ambulatory Visit: Payer: Self-pay | Admitting: Family Medicine

## 2016-01-17 DIAGNOSIS — R1032 Left lower quadrant pain: Secondary | ICD-10-CM

## 2016-01-21 ENCOUNTER — Ambulatory Visit
Admission: RE | Admit: 2016-01-21 | Discharge: 2016-01-21 | Disposition: A | Discharge status: Home / Self Care | Payer: Medicare Other | Source: Ambulatory Visit | Attending: Family Medicine | Admitting: Family Medicine

## 2016-01-21 DIAGNOSIS — R1032 Left lower quadrant pain: Secondary | ICD-10-CM | POA: Diagnosis not present

## 2016-03-06 ENCOUNTER — Other Ambulatory Visit: Payer: Self-pay

## 2016-03-09 ENCOUNTER — Encounter: Payer: Self-pay | Admitting: *Deleted

## 2016-03-10 NOTE — Discharge Instructions (Signed)

## 2016-03-12 ENCOUNTER — Ambulatory Visit: Payer: Medicare Other | Admitting: Anesthesiology

## 2016-03-12 ENCOUNTER — Ambulatory Visit
Admission: RE | Admit: 2016-03-12 | Discharge: 2016-03-12 | Disposition: A | Discharge status: Home / Self Care | Payer: Medicare Other | Source: Ambulatory Visit | Attending: Gastroenterology | Admitting: Gastroenterology

## 2016-03-12 ENCOUNTER — Encounter
Admission: RE | Disposition: A | Discharge status: Home / Self Care | Payer: Self-pay | Source: Ambulatory Visit | Attending: Gastroenterology

## 2016-03-12 DIAGNOSIS — H409 Unspecified glaucoma: Secondary | ICD-10-CM | POA: Diagnosis not present

## 2016-03-12 DIAGNOSIS — E7800 Pure hypercholesterolemia, unspecified: Secondary | ICD-10-CM | POA: Insufficient documentation

## 2016-03-12 DIAGNOSIS — K219 Gastro-esophageal reflux disease without esophagitis: Secondary | ICD-10-CM | POA: Diagnosis not present

## 2016-03-12 DIAGNOSIS — Z79899 Other long term (current) drug therapy: Secondary | ICD-10-CM | POA: Diagnosis not present

## 2016-03-12 DIAGNOSIS — M160 Bilateral primary osteoarthritis of hip: Secondary | ICD-10-CM | POA: Insufficient documentation

## 2016-03-12 DIAGNOSIS — Z85820 Personal history of malignant melanoma of skin: Secondary | ICD-10-CM | POA: Diagnosis not present

## 2016-03-12 DIAGNOSIS — N3010 Interstitial cystitis (chronic) without hematuria: Secondary | ICD-10-CM | POA: Diagnosis not present

## 2016-03-12 DIAGNOSIS — M545 Low back pain: Secondary | ICD-10-CM | POA: Diagnosis not present

## 2016-03-12 DIAGNOSIS — I10 Essential (primary) hypertension: Secondary | ICD-10-CM | POA: Insufficient documentation

## 2016-03-12 DIAGNOSIS — M7060 Trochanteric bursitis, unspecified hip: Secondary | ICD-10-CM | POA: Insufficient documentation

## 2016-03-12 DIAGNOSIS — Z888 Allergy status to other drugs, medicaments and biological substances status: Secondary | ICD-10-CM | POA: Diagnosis not present

## 2016-03-12 DIAGNOSIS — K222 Esophageal obstruction: Secondary | ICD-10-CM | POA: Diagnosis not present

## 2016-03-12 DIAGNOSIS — Z90710 Acquired absence of both cervix and uterus: Secondary | ICD-10-CM | POA: Insufficient documentation

## 2016-03-12 DIAGNOSIS — H8110 Benign paroxysmal vertigo, unspecified ear: Secondary | ICD-10-CM | POA: Diagnosis not present

## 2016-03-12 DIAGNOSIS — R1310 Dysphagia, unspecified: Secondary | ICD-10-CM

## 2016-03-12 DIAGNOSIS — Z881 Allergy status to other antibiotic agents status: Secondary | ICD-10-CM | POA: Diagnosis not present

## 2016-03-12 DIAGNOSIS — K449 Diaphragmatic hernia without obstruction or gangrene: Secondary | ICD-10-CM

## 2016-03-12 HISTORY — DX: Unspecified osteoarthritis, unspecified site: M19.90

## 2016-03-12 HISTORY — DX: Gastro-esophageal reflux disease without esophagitis: K21.9

## 2016-03-12 HISTORY — PX: ESOPHAGOGASTRODUODENOSCOPY (EGD) WITH PROPOFOL: SHX5813

## 2016-03-12 SURGERY — ESOPHAGOGASTRODUODENOSCOPY (EGD) WITH PROPOFOL
Anesthesia: Monitor Anesthesia Care | Wound class: Clean Contaminated

## 2016-03-12 MED ORDER — LACTATED RINGERS IV SOLN: INTRAVENOUS | Status: DC

## 2016-03-12 MED ORDER — STERILE WATER FOR IRRIGATION IR SOLN
Status: DC | PRN
  Administered 2016-03-12: 08:00:00

## 2016-03-12 MED ORDER — LACTATED RINGERS IV SOLN
INTRAVENOUS | Status: DC
  Administered 2016-03-12: 07:00:00 via INTRAVENOUS

## 2016-03-12 MED ORDER — ACETAMINOPHEN 325 MG PO TABS: 325.0000 mg | ORAL_TABLET | ORAL | Status: DC | PRN

## 2016-03-12 MED ORDER — ACETAMINOPHEN 160 MG/5ML PO SOLN: 325.0000 mg | ORAL | Status: DC | PRN

## 2016-03-12 MED ORDER — PROPOFOL 10 MG/ML IV BOLUS
INTRAVENOUS | Status: DC | PRN
  Administered 2016-03-12 (×3): 20 mg via INTRAVENOUS

## 2016-03-12 MED ORDER — SODIUM CHLORIDE 0.9 % IV SOLN: INTRAVENOUS | Status: DC

## 2016-03-12 SURGICAL SUPPLY — 31 items
BALLN DILATOR 10-12 8 (BALLOONS)
BALLN DILATOR 12-15 8 (BALLOONS)
BALLN DILATOR 15-18 8 (BALLOONS) ×3
BALLN DILATOR CRE 0-12 8 (BALLOONS)
BALLN DILATOR ESOPH 8 10 CRE (MISCELLANEOUS) IMPLANT
BALLOON DILATOR 12-15 8 (BALLOONS) IMPLANT
BALLOON DILATOR 15-18 8 (BALLOONS) ×1 IMPLANT
BALLOON DILATOR CRE 0-12 8 (BALLOONS) IMPLANT
BLOCK BITE 60FR ADLT L/F GRN (MISCELLANEOUS) ×3 IMPLANT
CANISTER SUCT 1200ML W/VALVE (MISCELLANEOUS) ×3 IMPLANT
CLIP HMST 235XBRD CATH ROT (MISCELLANEOUS) IMPLANT
CLIP RESOLUTION 360 11X235 (MISCELLANEOUS)
FCP ESCP3.2XJMB 240X2.8X (MISCELLANEOUS)
FORCEPS BIOP RAD 4 LRG CAP 4 (CUTTING FORCEPS) IMPLANT
FORCEPS BIOP RJ4 240 W/NDL (MISCELLANEOUS)
FORCEPS ESCP3.2XJMB 240X2.8X (MISCELLANEOUS) IMPLANT
GOWN CVR UNV OPN BCK APRN NK (MISCELLANEOUS) ×2 IMPLANT
GOWN ISOL THUMB LOOP REG UNIV (MISCELLANEOUS) ×4
INJECTOR VARIJECT VIN23 (MISCELLANEOUS) IMPLANT
KIT DEFENDO VALVE AND CONN (KITS) IMPLANT
KIT ENDO PROCEDURE OLY (KITS) ×3 IMPLANT
MARKER SPOT ENDO TATTOO 5ML (MISCELLANEOUS) IMPLANT
PAD GROUND ADULT SPLIT (MISCELLANEOUS) IMPLANT
SNARE SHORT THROW 13M SML OVAL (MISCELLANEOUS) IMPLANT
SNARE SHORT THROW 30M LRG OVAL (MISCELLANEOUS) IMPLANT
SPOT EX ENDOSCOPIC TATTOO (MISCELLANEOUS)
SYR INFLATION 60ML (SYRINGE) ×3 IMPLANT
TRAP ETRAP POLY (MISCELLANEOUS) IMPLANT
VARIJECT INJECTOR VIN23 (MISCELLANEOUS)
WATER STERILE IRR 250ML POUR (IV SOLUTION) ×3 IMPLANT
WIRE CRE 18-20MM 8CM F G (MISCELLANEOUS) IMPLANT

## 2016-03-12 NOTE — Anesthesia Preprocedure Evaluation (Signed)
Anesthesia Evaluation  Patient identified by MRN, date of birth, ID band  Reviewed: Allergy & Precautions, H&P , NPO status , Patient's Chart, lab work & pertinent test results  Airway Mallampati: II  TM Distance: >3 FB Neck ROM: full    Dental no notable dental hx.    Pulmonary    Pulmonary exam normal       Cardiovascular hypertension, Rhythm:regular Rate:Normal     Neuro/Psych    GI/Hepatic GERD-  ,  Endo/Other    Renal/GU      Musculoskeletal   Abdominal   Peds  Hematology   Anesthesia Other Findings   Reproductive/Obstetrics                             Anesthesia Physical Anesthesia Plan  ASA: II  Anesthesia Plan: MAC   Post-op Pain Management:    Induction:   Airway Management Planned:   Additional Equipment:   Intra-op Plan:   Post-operative Plan:   Informed Consent: I have reviewed the patients History and Physical, chart, labs and discussed the procedure including the risks, benefits and alternatives for the proposed anesthesia with the patient or authorized representative who has indicated his/her understanding and acceptance.     Plan Discussed with: CRNA  Anesthesia Plan Comments:         Anesthesia Quick Evaluation  

## 2016-03-12 NOTE — Anesthesia Postprocedure Evaluation (Signed)
Anesthesia Post Note  Patient: Sara Jackson  Procedure(s) Performed: Procedure(s) (LRB): ESOPHAGOGASTRODUODENOSCOPY (EGD) WITH PROPOFOL with dilation (N/A)  Patient location during evaluation: PACU Anesthesia Type: MAC Level of consciousness: awake and alert and oriented Pain management: satisfactory to patient Vital Signs Assessment: post-procedure vital signs reviewed and stable Respiratory status: spontaneous breathing, nonlabored ventilation and respiratory function stable Cardiovascular status: blood pressure returned to baseline and stable Postop Assessment: Adequate PO intake and No signs of nausea or vomiting Anesthetic complications: no    Raliegh Ip

## 2016-03-12 NOTE — H&P (Signed)
[facility]  [address], [place], [place] [address] Phone: [phone] Fax : [phone]  Primary Care Physician:  Sherrin Daisy, MD Primary Gastroenterologist:  Dr. Allen Norris  Pre-Procedure History & Physical: HPI:  Sara Jackson is a 75 y.o. female is here for an endoscopy.   Past Medical History  Diagnosis Date  . Chronic interstitial cystitis   . Vaginal atrophy   . Bladder pain   . Cystocele   . Abdominal pain   . Essential (primary) hypertension 07/11/2014  . Benign paroxysmal positional nystagmus 07/11/2014  . Bursitis, trochanteric 05/02/2014  . Cystocele, midline 07/04/2015  . AP (abdominal pain) 07/04/2015  . Glaucoma 07/11/2014  . Hypercholesteremia 07/11/2014  . Impingement syndrome of shoulder 10/18/2014  . LBP (low back pain) 05/02/2014  . 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[place]) 04/25/2014  . GERD (gastroesophageal reflux disease)   . Arthritis     hips, lower back    Past Surgical History  Procedure Laterality Date  . Breast biopsy Left   . Radical hysterectomy    . Melanoma excision      Prior to Admission medications   Medication Sig Start Date End Date Taking? Authorizing Provider  acetaminophen (TYLENOL) 325 MG tablet Take by mouth. 07/11/14  Yes Historical Provider, MD  Cholecalciferol (VITAMIN D PO) Take by mouth.   Yes Historical Provider, MD  conjugated estrogens (PREMARIN) vaginal cream PLACE 0.5G VAGINALLY ONCE DAILY. 11/26/14  Yes Historical Provider, MD  estrogens, conjugated, (PREMARIN) 0.625 MG tablet Take 0.625 mg by mouth daily. Takes 1/2 tab daily   Yes Historical Provider, MD  ibuprofen (ADVIL,MOTRIN) 200 MG tablet Take 200 mg by mouth every 6 (six) hours as needed.   Yes Historical Provider, MD  latanoprost (XALATAN) 0.005 % ophthalmic solution 1 drop at bedtime.   Yes Historical Provider, MD  lisinopril-hydrochlorothiazide (PRINZIDE,ZESTORETIC) 20-25 MG per tablet Take by mouth. Takes 1/2 tablet 07/11/14  Yes Historical Provider,  MD  pantoprazole (PROTONIX) 40 MG tablet Take 40 mg by mouth daily.   Yes Historical Provider, MD    Allergies as of 03/06/2016 - Review Complete 10/30/2015  Allergen Reaction Noted  . Azo [phenazopyridine] Itching 06/20/2015  . Colesevelam hcl Diarrhea 07/04/2015  . Ezetimibe Diarrhea 07/04/2015  . Fluoxetine Other (See Comments) 10/30/2015  . Lansoprazole Diarrhea 07/04/2015  . Statins Other (See Comments) 06/20/2015  . Ace inhibitors Rash 07/04/2015  . Doxycycline Rash 07/04/2015  . Estradiol Rash 07/04/2015  . Nsaids Rash 07/04/2015  . Risedronate sodium Rash 07/04/2015    Family History  Problem Relation Age of Onset  . Kidney cancer Neg Hx   . Bladder Cancer Neg Hx     Social History   Social History  . Marital Status: Married    Spouse Name: N/A  . Number of Children: N/A  . Years of Education: N/A   Occupational History  . Not on file.   Social History Main Topics  . Smoking status: Never Smoker   . Smokeless tobacco: Not on file  . Alcohol Use: No  . Drug Use: No  . Sexual Activity: Not on file   Other Topics Concern  . Not on file   Social History Narrative    Review of Systems: See HPI, otherwise negative ROS  Physical Exam: BP 152/60 mmHg  Pulse 87  Temp(Src) 98.1 F (36.7 C) (Temporal)  Resp 16  Ht 5\' 5"  (1.651 m)  Wt 154 lb (69.854 kg)  BMI 25.63 kg/m2  SpO2 97% General:  Alert,  pleasant and cooperative in NAD Head:  Normocephalic and atraumatic. Neck:  Supple; no masses or thyromegaly. Lungs:  Clear throughout to auscultation.    Heart:  Regular rate and rhythm. Abdomen:  Soft, nontender and nondistended. Normal bowel sounds, without guarding, and without rebound.   Neurologic:  Alert and  oriented x4;  grossly normal neurologically.  Impression/Plan: Sara Jackson is here for an endoscopy to be performed for dysphagia  Risks, benefits, limitations, and alternatives regarding  endoscopy have been reviewed with the  patient.  Questions have been answered.  All parties agreeable.   Lucilla Lame, MD  03/12/2016, 7:16 AM

## 2016-03-12 NOTE — Op Note (Signed)
[facility] Patient Name: Sara Jackson Procedure Date: 03/12/2016 7:30 AM MRN: [id number] Account #: [id number] Date of Birth: 1940/12/19 Admit Type: Outpatient Age: 75 Room: [facility] OR ROOM 01 Gender: Female Note Status: Finalized Procedure:            Upper GI endoscopy Indications:          Dysphagia Providers:            Lucilla Lame, MD Referring MD:         Shirline Frees (Referring MD) Medicines:            Propofol per Anesthesia Complications:        No immediate complications. Procedure:            Pre-Anesthesia Assessment:                       - Prior to the procedure, a History and Physical was                        performed, and patient medications and allergies were                        reviewed. The patient's tolerance of previous                        anesthesia was also reviewed. The risks and benefits of                        the procedure and the sedation options and risks were                        discussed with the patient. All questions were                        answered, and informed consent was obtained. Prior                        Anticoagulants: The patient has taken no previous                        anticoagulant or antiplatelet agents. ASA Grade                        Assessment: II - A patient with mild systemic disease.                        After reviewing the risks and benefits, the patient was                        deemed in satisfactory condition to undergo the                        procedure.                       After obtaining informed consent, the endoscope was                        passed under direct vision. Throughout the procedure,  the patient's blood pressure, pulse, and oxygen                        saturations were monitored continuously. The Olympus                        GIF H180J endoscope (S#: [id number]) was introduced                        through the  mouth, and advanced to the second part of                        duodenum. The upper GI endoscopy was accomplished                        without difficulty. The patient tolerated the procedure                        well. Findings:      A small hiatal hernia was present.      One mild benign-appearing, intrinsic stenosis was found at the       gastroesophageal junction. And was traversed. A TTS dilator was passed       through the scope. Dilation with a 15-16.5-18 mm balloon dilator was       performed to 18 mm.      The stomach was normal.      The examined duodenum was normal. Impression:           - Small hiatal hernia.                       - Benign-appearing esophageal stenosis. Dilated.                       - Normal stomach.                       - Normal examined duodenum.                       - No specimens collected. Recommendation:       - Repeat upper endoscopy PRN for retreatment. Procedure Code(s):    --- Professional ---                       [phone], Esophagogastroduodenoscopy, flexible, transoral;                        with transendoscopic balloon dilation of esophagus                        (less than 30 mm diameter) Diagnosis Code(s):    --- Professional ---                       R13.10, Dysphagia, unspecified                       K44.9, Diaphragmatic hernia without obstruction or                        gangrene  K22.2, Esophageal obstruction CPT copyright 2016 American Medical Association. All rights reserved. The codes documented in this report are preliminary and upon coder review may  be revised to meet current compliance requirements. Lucilla Lame, MD 03/12/2016 7:44:46 AM This report has been signed electronically. Number of Addenda: 0 Note Initiated On: 03/12/2016 7:30 AM Total Procedure Duration: 0 hours 6 minutes 21 seconds       [facility]

## 2016-03-12 NOTE — Transfer of Care (Signed)
Immediate Anesthesia Transfer of Care Note  Patient: Sara Jackson  Procedure(s) Performed: Procedure(s): ESOPHAGOGASTRODUODENOSCOPY (EGD) WITH PROPOFOL with dilation (N/A)  Patient Location: PACU  Anesthesia Type: MAC  Level of Consciousness: awake, alert  and patient cooperative  Airway and Oxygen Therapy: Patient Spontanous Breathing and Patient connected to supplemental oxygen  Post-op Assessment: Post-op Vital signs reviewed, Patient's Cardiovascular Status Stable, Respiratory Function Stable, Patent Airway and No signs of Nausea or vomiting  Post-op Vital Signs: Reviewed and stable  Complications: No apparent anesthesia complications

## 2016-03-12 NOTE — Anesthesia Procedure Notes (Signed)
Procedure Name: MAC Performed by: Briante Loveall Pre-anesthesia Checklist: Patient identified, Emergency Drugs available, Suction available, Timeout performed and Patient being monitored Patient Re-evaluated:Patient Re-evaluated prior to inductionOxygen Delivery Method: Nasal cannula Placement Confirmation: positive ETCO2     

## 2016-03-13 ENCOUNTER — Encounter: Payer: Self-pay | Admitting: Gastroenterology

## 2016-07-14 ENCOUNTER — Ambulatory Visit (INDEPENDENT_AMBULATORY_CARE_PROVIDER_SITE_OTHER): Payer: Medicare Other | Admitting: Urology

## 2016-07-14 ENCOUNTER — Encounter: Payer: Self-pay | Admitting: Urology

## 2016-07-14 VITALS — BP 155/77 | HR 90 | Ht 65.0 in | Wt 156.6 lb

## 2016-07-14 DIAGNOSIS — N3010 Interstitial cystitis (chronic) without hematuria: Secondary | ICD-10-CM

## 2016-07-14 DIAGNOSIS — R350 Frequency of micturition: Secondary | ICD-10-CM | POA: Diagnosis not present

## 2016-07-14 DIAGNOSIS — N952 Postmenopausal atrophic vaginitis: Secondary | ICD-10-CM

## 2016-07-14 DIAGNOSIS — R1012 Left upper quadrant pain: Secondary | ICD-10-CM

## 2016-07-14 DIAGNOSIS — R109 Unspecified abdominal pain: Principal | ICD-10-CM

## 2016-07-14 DIAGNOSIS — G8929 Other chronic pain: Secondary | ICD-10-CM

## 2016-07-14 LAB — URINALYSIS, COMPLETE
Bilirubin, UA: NEGATIVE
GLUCOSE, UA: NEGATIVE
Ketones, UA: NEGATIVE
LEUKOCYTES UA: NEGATIVE
Nitrite, UA: NEGATIVE
PH UA: 5.5 (ref 5.0–7.5)
PROTEIN UA: NEGATIVE
Specific Gravity, UA: 1.025 (ref 1.005–1.030)
Urobilinogen, Ur: 0.2 mg/dL (ref 0.2–1.0)

## 2016-07-14 LAB — MICROSCOPIC EXAMINATION: Epithelial Cells (non renal): >10 /hpf — AB (ref 0–10)

## 2016-07-14 MED ORDER — LIDOCAINE HCL 2 % EX GEL
1.0000 "application " | Freq: Once | CUTANEOUS | Status: AC
  Administered 2016-07-14: 1 via URETHRAL

## 2016-07-14 MED ORDER — SODIUM BICARBONATE 8.4 % IV SOLN
11.0000 mL | Freq: Once | INTRAVENOUS | Status: AC
  Administered 2016-07-14: 11 mL

## 2016-07-14 NOTE — Progress Notes (Signed)
07/14/2016 3:33 PM   Sara Jackson 1941/08/29 [id number]  Referring provider: Sherrin Daisy, MD Dwight Pembroke, [place] [id number]  Chief Complaint  Patient presents with  . Cystitis    Rescue Solution    HPI: Patient is a 75 year old Caucasian female with interstitial cystitis who presents today for a rescue solution.  Patient was last seen in 10/2015 for symptoms of mild dysuria and frequency.  She was scheduled to have a rescue solution at that time, but her UA was nitrite positive.  Rescue solution was held and urine send for culture.  The urine culture was negative and patient's symptoms abated.  She has been symptom free since that time until one week ago.  Now, she is having frequency, urgency and nocturia.  She denies fevers, chills, nausea and vomiting.  She is not having dysuria or hematuria.    Patient has been having left lower quadrant pain that radiates to the left flank area.  This has been present for several months.  She has been evaluated by her PCP and gynecology for this pain and no etiology has been discovered.  The pain lasts for several hours and will wake her up at night.  She has not realized anything that makes the pain worse or better.  She states it is a deep pain.  She does not have any constipation.  She is not having any difficulty with BM's or blood in her stool.    She does not have a history of nephrolithiasis or family history of nephrolithiasis.    Pelvic US completed on 01/21/2016 noted 1.3 x 2.1 x 1.3 cm cyst right ovary, most likely unchanged from prior MRI.   Patient's gynecologist did not recommend further work up.    PMH: Past Medical History:  Diagnosis Date  . Abdominal pain   . AP (abdominal pain) 07/04/2015  . Arthritis    hips, lower back  . Benign paroxysmal positional nystagmus 07/11/2014  . Bladder pain   . Bursitis, trochanteric 05/02/2014  . Chronic interstitial cystitis   . Cystocele   . Cystocele, midline  07/04/2015  . Essential (primary) hypertension 07/11/2014  . GERD (gastroesophageal reflux disease)   . Glaucoma 07/11/2014  . Hypercholesteremia 07/11/2014  . Impingement syndrome of shoulder 10/18/2014  . LBP (low back pain) 05/02/2014  . 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 Vaginal atrophy     Surgical History: Past Surgical History:  Procedure Laterality Date  . BREAST BIOPSY Left   . ESOPHAGOGASTRODUODENOSCOPY (EGD) WITH PROPOFOL N/A 03/12/2016   Procedure: ESOPHAGOGASTRODUODENOSCOPY (EGD) WITH PROPOFOL with dilation;  Surgeon: Lucilla Lame, MD;  Location: [facility];  Service: Endoscopy;  Laterality: N/A;  . MELANOMA EXCISION    . RADICAL HYSTERECTOMY      Home Medications:    Medication List       Accurate as of 07/14/16  3:33 PM. Always use your most recent med list.          acetaminophen 325 MG tablet Commonly known as:  TYLENOL Take by mouth.   cetirizine 10 MG tablet Commonly known as:  ZYRTEC TAKE 1 TABLET BY MOUTH DAILY   estradiol 1 MG tablet Commonly known as:  ESTRACE Take 0.5 mg by mouth.   gabapentin 100 MG capsule Commonly known as:  NEURONTIN Take by mouth.   ibuprofen 200 MG tablet Commonly known as:  ADVIL,MOTRIN Take 200 mg by mouth every 6 (six) hours as needed.   latanoprost  0.005 % ophthalmic solution Commonly known as:  XALATAN 1 drop at bedtime.   lisinopril-hydrochlorothiazide 20-25 MG tablet Commonly known as:  PRINZIDE,ZESTORETIC Take by mouth. Takes 1/2 tablet   meclizine 25 MG tablet Commonly known as:  ANTIVERT TAKE 1 TABLET BY MOUTH THREE TIMES DAILY AS NEEDED FOR DIZZINESS   oxybutynin 5 MG 24 hr tablet Commonly known as:  DITROPAN-XL TK 1 T PO D   pantoprazole 40 MG tablet Commonly known as:  PROTONIX Take 40 mg by mouth daily.   PREMARIN vaginal cream Generic drug:  conjugated estrogens PLACE 0.5 GRAMS VAGINALLY ONCE D   ranitidine 150 MG capsule Commonly known as:  ZANTAC Take by mouth.     VITAMIN D PO Take by mouth.       Allergies:  Allergies  Allergen Reactions  . Azo [Phenazopyridine] Itching  . Colesevelam Diarrhea  . Colesevelam Hcl Diarrhea  . Doxycycline Hyclate Other (See Comments)    Other Reaction: OTHER REACTION  . Ezetimibe Diarrhea  . Fluoxetine Other (See Comments)    Other Reaction: INSOMNIA Other Reaction: INSOMNIA  . Lansoprazole Diarrhea  . Statins Diarrhea and Other (See Comments)    diarrhea     . Ace Inhibitors Rash    Other Reaction: INSOMNIA Other Reaction: INSOMNIA  . Doxycycline Rash    Other Reaction: OTHER REACTION    . Estradiol Rash and Other (See Comments)    Other Reaction: OTHER REACTION    . Nsaids Rash    Other Reaction: GI UPSET  . Risedronate Sodium Rash    Other Reaction: OTHER REACTION      Family History: Family History  Problem Relation Age of Onset  . Kidney cancer Neg Hx   . Bladder Cancer Neg Hx     Social History:  reports that she has never smoked. She has never used smokeless tobacco. She reports that she does not drink alcohol or use drugs.  ROS: UROLOGY Frequent Urination?: Yes Hard to postpone urination?: Yes Burning/pain with urination?: No Get up at night to urinate?: Yes Leakage of urine?: No Urine stream starts and stops?: No Trouble starting stream?: No Do you have to strain to urinate?: No Blood in urine?: No Urinary tract infection?: No Sexually transmitted disease?: No Injury to kidneys or bladder?: No Painful intercourse?: No Weak stream?: No Currently pregnant?: No Vaginal bleeding?: No Last menstrual period?: n  Gastrointestinal Nausea?: No Vomiting?: No Indigestion/heartburn?: Yes Diarrhea?: No Constipation?: No  Constitutional Fever: No Night sweats?: No Weight loss?: No Fatigue?: No  Skin Skin rash/lesions?: No Itching?: No  Eyes Blurred vision?: No Double vision?: No  Ears/Nose/Throat Sore throat?: No Sinus problems?:  No  Hematologic/Lymphatic Swollen glands?: No Easy bruising?: No  Cardiovascular Leg swelling?: No Chest pain?: No  Respiratory Cough?: No Shortness of breath?: No  Endocrine Excessive thirst?: No  Musculoskeletal Back pain?: No Joint pain?: Yes  Neurological Headaches?: No Dizziness?: No  Psychologic Depression?: No Anxiety?: No  Physical Exam: BP (!) 155/77   Pulse 90   Ht 5\' 5"  (1.651 m)   Wt 156 lb 9.6 oz (71 kg)   BMI 26.06 kg/m   Constitutional: Well nourished. Alert and oriented, No acute distress. HEENT: [place] AT, moist mucus membranes. Trachea midline, no masses. Cardiovascular: No clubbing, cyanosis, or edema. Respiratory: Normal respiratory effort, no increased work of breathing. Skin: No rashes, bruises or suspicious lesions. Lymph: No cervical or inguinal adenopathy. Neurologic: Grossly intact, no focal deficits, moving all 4 extremities. Psychiatric: Normal mood and  affect.  Laboratory Data:  Urinalysis Unremarkable.  See EPIC.    Pertinent Imaging: CLINICAL DATA:  Left lower quadrant pain.  EXAM: TRANSABDOMINAL AND TRANSVAGINAL ULTRASOUND OF PELVIS  TECHNIQUE: Both transabdominal and transvaginal ultrasound examinations of the pelvis were performed. Transabdominal technique was performed for global imaging of the pelvis including uterus, ovaries, adnexal regions, and pelvic cul-de-sac. It was necessary to proceed with endovaginal exam following the transabdominal exam to visualize the ovaries.  COMPARISON:  MRI 07/13/2014.  FINDINGS: Uterus  Hysterectomy.  Right ovary  Measurements: 2.3 x 1.4 x 1.9 cm.  1.3 x 2.1 x 1.3 cm cyst.  This is most likely stable from prior MRI.  Left ovary  Not visualized.  Other findings  No abnormal free fluid.  IMPRESSION: 1.3 x 2.1 x 1.3 cm cyst right ovary, most likely unchanged from prior MRI. This is almost certainly benign, but follow up ultrasound is recommended in 1  year according to the Society of Radiologists in [place] Statement (D Clovis Riley et al. Management of Asymptomatic Ovarian and Other Adnexal Cysts Imaged at [place]: Society of Radiologists in [place] Statement 2010. Radiology 256 (Sept 2010): [id number].).   Electronically Signed   By: Marcello Moores  Register   On: 01/21/2016 10:16      Assessment & Plan:    1. Interstitial cystitis  - rescue solution given today  - Urinalysis, Complete  - RTC in one week for next solution  2. Left flank pain  - obtain a KUB today  - will call patient with results  3. Vaginal atrophy  - continue vaginal estrogen cream  4. Frequency  - continue oxybutynin   Return in about 1 week (around 07/21/2016) for rescue solution.  These notes generated with voice recognition software. I apologize for typographical errors.  Zara Council, [facility] [address], [place], [place] [address] [phone]

## 2016-07-14 NOTE — Progress Notes (Signed)
Bladder Rescue Solution Instillation  Due to cystitis patient is present today for a Rescue Solution Treatment.  Patient was cleaned and prepped in a sterile fashion with betadine and lidocaine 2% jelly was instilled into the urethra.  A 14 FR catheter was inserted, urine return was noted 72ml, urine was yellow in color.  Instilled a solution consisting of 63ml of Sodium Bicarb, 2 ml Lidocaine and 1 ml of Heparin. The catheter was then removed. Patient tolerated well, no complications were noted.   Performed by: Lyndee Hensen CMA  Follow up/ Additional Notes: One week

## 2016-07-15 ENCOUNTER — Ambulatory Visit
Admission: RE | Admit: 2016-07-15 | Discharge: 2016-07-15 | Disposition: A | Discharge status: Home / Self Care | Payer: Medicare Other | Source: Ambulatory Visit | Attending: Urology | Admitting: Urology

## 2016-07-15 ENCOUNTER — Telehealth: Payer: Self-pay | Admitting: Family Medicine

## 2016-07-15 DIAGNOSIS — G8929 Other chronic pain: Secondary | ICD-10-CM | POA: Diagnosis present

## 2016-07-15 DIAGNOSIS — R109 Unspecified abdominal pain: Principal | ICD-10-CM

## 2016-07-15 DIAGNOSIS — R1012 Left upper quadrant pain: Secondary | ICD-10-CM | POA: Diagnosis not present

## 2016-07-15 NOTE — Telephone Encounter (Signed)
-----   Message from Nori Riis, PA-C sent at 07/15/2016  1:01 PM EDT ----- Please notify the patient that no stones were seen on her x-ray.  If she is still having discomfort, I would suggest a renal ultrasound.  Is she coming next week for her rescue solution?

## 2016-07-15 NOTE — Telephone Encounter (Signed)
Patient notified and states she is feeling pretty good right now. She will not be in for her rescue solution. She will call when she needs [place].

## 2016-11-17 ENCOUNTER — Ambulatory Visit (INDEPENDENT_AMBULATORY_CARE_PROVIDER_SITE_OTHER): Payer: Medicare Other | Admitting: Family Medicine

## 2016-11-17 VITALS — BP 164/90 | HR 91

## 2016-11-17 DIAGNOSIS — R350 Frequency of micturition: Secondary | ICD-10-CM | POA: Diagnosis not present

## 2016-11-17 LAB — URINALYSIS, COMPLETE
Bilirubin, UA: NEGATIVE
Glucose, UA: NEGATIVE
Ketones, UA: NEGATIVE
LEUKOCYTES UA: NEGATIVE
Nitrite, UA: NEGATIVE
PH UA: 5.5 (ref 5.0–7.5)
Specific Gravity, UA: 1.025 (ref 1.005–1.030)
UUROB: 0.2 mg/dL (ref 0.2–1.0)

## 2016-11-17 LAB — MICROSCOPIC EXAMINATION: BACTERIA UA: NONE SEEN

## 2016-11-17 NOTE — Progress Notes (Signed)
Patient presents today for urinary frequency and flank pain, lower abdominal pain. She has not been on ABX in the last 30 days and she has not had a Urological surgery in the last 30 days.

## 2016-11-17 NOTE — Progress Notes (Signed)
In and Out Catheterization  Patient is present today for a I & O catheterization due to urinary frequency. Patient was cleaned and prepped in a sterile fashion with betadine and Lidocaine 2% jelly was instilled into the urethra.  A 14 FR cath was inserted no complications were noted , 29ml of urine return was noted, urine was yellow in color. A clean urine sample was collected for UCX. Bladder was drained  And catheter was removed with out difficulty.    Preformed by: Elberta Leatherwood, CMA

## 2016-11-20 LAB — CULTURE, URINE COMPREHENSIVE

## 2016-11-23 ENCOUNTER — Telehealth: Payer: Self-pay

## 2016-11-23 NOTE — Telephone Encounter (Signed)
LMOM- most recent labs are negative. If continuing to have problems call to make appt.

## 2016-11-23 NOTE — Telephone Encounter (Signed)
-----   Message from Nori Riis, PA-C sent at 11/22/2016  4:03 PM EST ----- Patient's urine culture was negative.  If she is still having symptoms, I suggest she have an office visit.

## 2016-12-07 ENCOUNTER — Other Ambulatory Visit: Payer: Self-pay | Admitting: Family Medicine

## 2016-12-07 DIAGNOSIS — R1032 Left lower quadrant pain: Secondary | ICD-10-CM

## 2016-12-16 ENCOUNTER — Ambulatory Visit
Admission: RE | Admit: 2016-12-16 | Discharge: 2016-12-16 | Disposition: A | Discharge status: Home / Self Care | Payer: Medicare Other | Source: Ambulatory Visit | Attending: Family Medicine | Admitting: Family Medicine

## 2016-12-16 DIAGNOSIS — R1032 Left lower quadrant pain: Secondary | ICD-10-CM | POA: Diagnosis present

## 2016-12-16 DIAGNOSIS — K5730 Diverticulosis of large intestine without perforation or abscess without bleeding: Secondary | ICD-10-CM | POA: Diagnosis not present

## 2016-12-16 DIAGNOSIS — N83201 Unspecified ovarian cyst, right side: Secondary | ICD-10-CM | POA: Insufficient documentation

## 2016-12-16 MED ORDER — IOPAMIDOL (ISOVUE-300) INJECTION 61%
100.0000 mL | Freq: Once | INTRAVENOUS | Status: AC | PRN
  Administered 2016-12-16: 100 mL via INTRAVENOUS

## 2017-08-12 ENCOUNTER — Other Ambulatory Visit: Payer: Self-pay | Admitting: Unknown Physician Specialty

## 2017-08-12 DIAGNOSIS — M5442 Lumbago with sciatica, left side: Secondary | ICD-10-CM

## 2017-08-24 ENCOUNTER — Ambulatory Visit
Admission: RE | Admit: 2017-08-24 | Discharge: 2017-08-24 | Disposition: A | Discharge status: Home / Self Care | Payer: Medicare Other | Source: Ambulatory Visit | Attending: Unknown Physician Specialty | Admitting: Unknown Physician Specialty

## 2017-08-24 DIAGNOSIS — M48061 Spinal stenosis, lumbar region without neurogenic claudication: Secondary | ICD-10-CM | POA: Insufficient documentation

## 2017-08-24 DIAGNOSIS — I700 Atherosclerosis of aorta: Secondary | ICD-10-CM | POA: Insufficient documentation

## 2017-08-24 DIAGNOSIS — M4686 Other specified inflammatory spondylopathies, lumbar region: Secondary | ICD-10-CM | POA: Diagnosis not present

## 2017-08-24 DIAGNOSIS — K8020 Calculus of gallbladder without cholecystitis without obstruction: Secondary | ICD-10-CM | POA: Diagnosis not present

## 2017-08-24 DIAGNOSIS — K5730 Diverticulosis of large intestine without perforation or abscess without bleeding: Secondary | ICD-10-CM | POA: Diagnosis not present

## 2017-08-24 DIAGNOSIS — M5442 Lumbago with sciatica, left side: Secondary | ICD-10-CM

## 2018-09-13 ENCOUNTER — Other Ambulatory Visit: Payer: Self-pay | Admitting: Family Medicine

## 2018-09-13 DIAGNOSIS — Z13820 Encounter for screening for osteoporosis: Secondary | ICD-10-CM

## 2018-09-27 ENCOUNTER — Ambulatory Visit
Admission: RE | Admit: 2018-09-27 | Discharge: 2018-09-27 | Disposition: A | Discharge status: Home / Self Care | Payer: Medicare Other | Source: Ambulatory Visit | Attending: Family Medicine | Admitting: Family Medicine

## 2018-09-27 ENCOUNTER — Encounter (INDEPENDENT_AMBULATORY_CARE_PROVIDER_SITE_OTHER): Payer: Self-pay

## 2018-09-27 DIAGNOSIS — M85851 Other specified disorders of bone density and structure, right thigh: Secondary | ICD-10-CM | POA: Diagnosis not present

## 2018-09-27 DIAGNOSIS — M810 Age-related osteoporosis without current pathological fracture: Secondary | ICD-10-CM | POA: Insufficient documentation

## 2018-09-27 DIAGNOSIS — Z13820 Encounter for screening for osteoporosis: Secondary | ICD-10-CM | POA: Diagnosis not present

## 2018-11-09 ENCOUNTER — Ambulatory Visit
Admission: RE | Admit: 2018-11-09 | Discharge: 2018-11-09 | Disposition: A | Discharge status: Home / Self Care | Payer: Medicare Other | Source: Ambulatory Visit | Attending: Nurse Practitioner | Admitting: Nurse Practitioner

## 2018-11-09 ENCOUNTER — Other Ambulatory Visit: Payer: Self-pay | Admitting: Nurse Practitioner

## 2018-11-09 ENCOUNTER — Other Ambulatory Visit: Admission: RE | Admit: 2018-11-09 | Payer: Medicare Other | Source: Home / Self Care

## 2018-11-09 ENCOUNTER — Ambulatory Visit
Admission: RE | Admit: 2018-11-09 | Discharge: 2018-11-09 | Disposition: A | Discharge status: Home / Self Care | Payer: Medicare Other | Attending: Nurse Practitioner | Admitting: Nurse Practitioner

## 2018-11-09 DIAGNOSIS — R05 Cough: Secondary | ICD-10-CM | POA: Insufficient documentation

## 2018-11-09 DIAGNOSIS — R059 Cough, unspecified: Secondary | ICD-10-CM

## 2018-11-09 DIAGNOSIS — R9389 Abnormal findings on diagnostic imaging of other specified body structures: Secondary | ICD-10-CM

## 2018-11-22 ENCOUNTER — Ambulatory Visit
Admission: RE | Admit: 2018-11-22 | Discharge: 2018-11-22 | Disposition: A | Discharge status: Home / Self Care | Payer: Medicare Other | Source: Ambulatory Visit | Attending: Nurse Practitioner | Admitting: Nurse Practitioner

## 2018-11-22 ENCOUNTER — Encounter (INDEPENDENT_AMBULATORY_CARE_PROVIDER_SITE_OTHER): Payer: Self-pay

## 2018-11-22 DIAGNOSIS — R9389 Abnormal findings on diagnostic imaging of other specified body structures: Secondary | ICD-10-CM

## 2018-11-28 ENCOUNTER — Ambulatory Visit: Admission: RE | Admit: 2018-11-28 | Payer: No Typology Code available for payment source | Source: Ambulatory Visit

## 2021-08-11 ENCOUNTER — Other Ambulatory Visit: Payer: Self-pay | Admitting: Family Medicine

## 2021-08-11 ENCOUNTER — Other Ambulatory Visit: Payer: Self-pay

## 2021-08-11 ENCOUNTER — Ambulatory Visit
Admission: RE | Admit: 2021-08-11 | Discharge: 2021-08-11 | Disposition: A | Discharge status: Home / Self Care | Payer: Medicare Other | Source: Ambulatory Visit | Attending: Family Medicine | Admitting: Family Medicine

## 2021-08-11 ENCOUNTER — Ambulatory Visit
Admission: RE | Admit: 2021-08-11 | Discharge: 2021-08-11 | Disposition: A | Discharge status: Home / Self Care | Payer: Medicare Other | Attending: Family Medicine | Admitting: Family Medicine

## 2021-08-11 DIAGNOSIS — M545 Low back pain, unspecified: Secondary | ICD-10-CM | POA: Diagnosis not present

## 2021-08-11 DIAGNOSIS — M25551 Pain in right hip: Secondary | ICD-10-CM

## 2022-02-24 ENCOUNTER — Ambulatory Visit (INDEPENDENT_AMBULATORY_CARE_PROVIDER_SITE_OTHER): Payer: Medicare Other | Admitting: Cardiovascular Disease

## 2022-02-24 ENCOUNTER — Ambulatory Visit (INDEPENDENT_AMBULATORY_CARE_PROVIDER_SITE_OTHER): Payer: Medicare Other

## 2022-02-24 ENCOUNTER — Encounter: Payer: Self-pay | Admitting: Cardiovascular Disease

## 2022-02-24 VITALS — BP 152/84 | HR 75 | Ht 65.0 in | Wt 132.5 lb

## 2022-02-24 DIAGNOSIS — I2510 Atherosclerotic heart disease of native coronary artery without angina pectoris: Secondary | ICD-10-CM | POA: Diagnosis not present

## 2022-02-24 DIAGNOSIS — R55 Syncope and collapse: Secondary | ICD-10-CM

## 2022-02-24 DIAGNOSIS — I10 Essential (primary) hypertension: Secondary | ICD-10-CM

## 2022-02-24 NOTE — Progress Notes (Signed)
Cardiology Office Note ? ?Date:  02/24/2022  ? ?ID:  Sara Jackson, DOB 01-02-1941, MRN [id number] ? ?PCP:  [facility] [employer]  ? ?Chief Complaint  ?Patient presents with  ? New Patient (Initial Visit)  ?  Ref by [facility] for syncope. Patient c/o weakness and fatigue, shortness of breath at times, leg weakness, pre-syncope, chest discomfort that radiates through to her back and pounding in chest. Medications reviewed by the patient verbally.   ? ? ?HPI:  ?Sara Jackson is a 81 year old woman with past medical history of ?Degenerative disc disease lumbar spine ?Hypertension ?Who presents by referral from Ranae Plumber for consultation of near syncope ? ?By primary care January 27, 2022 ?On that visit reported having some leg numbness, dizziness ?In terms of her dizziness/near syncope episodes, reports she is unaware when they are going to happen ?Reports typically when she is standing and walking ?Happens once every 2 weeks or so ?Feels that she is hydrated, drinks first thing when she gets up in the morning ?Some confusion on her home blood pressure medications, reports they are not low ?Orthostatics negative on today's visit, in fact blood pressure running between 366 up to 294 systolic ? ?Lab work reviewed, normal CBC, BMP ? ?Currently taking lisinopril HCTZ 20/12.5 mg 1/2 pill daily ?Intolerance to Zetia, Lipitor ? ?CT chest 2020 with coronary calcifications ? ?EKG personally reviewed by myself on todays visit ?Normal sinus rhythm rate 75 bpm no significant ST-T wave changes ? ? 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 ? ?PSH:    ?Past Surgical History:  ?Procedure Laterality Date  ? BREAST BIOPSY Left   ? ESOPHAGOGASTRODUODENOSCOPY (EGD) WITH PROPOFOL N/A 03/12/2016  ? Procedure: ESOPHAGOGASTRODUODENOSCOPY (EGD) WITH PROPOFOL with dilation;  Surgeon: Lucilla Lame, MD;  Location: [facility];  Service: Endoscopy;  Laterality: N/A;  ? MELANOMA EXCISION    ? RADICAL HYSTERECTOMY    ? ? ?Current Outpatient Medications  ?Medication Sig Dispense Refill  ? acetaminophen (TYLENOL) 325 MG tablet Take by mouth.    ? Cholecalciferol (VITAMIN D PO) Take by mouth.    ? estradiol (ESTRACE) 1 MG tablet Take 0.5 mg by mouth.     ? latanoprost (XALATAN) 0.005 % ophthalmic solution 1 drop at bedtime.    ? lisinopril-hydrochlorothiazide (PRINZIDE,ZESTORETIC) 20-25 MG per tablet Take by mouth. Takes 1/2 tablet    ? pantoprazole (PROTONIX) 40 MG tablet Take 40 mg by mouth daily.    ? PREMARIN vaginal cream PLACE 0.5 GRAMS VAGINALLY ONCE D  0  ? Probiotic, Lactobacillus, CAPS Take by mouth.    ? cetirizine (ZYRTEC) 10 MG tablet TAKE 1 TABLET BY MOUTH DAILY (Patient not taking: Reported on 02/24/2022)    ? gabapentin (NEURONTIN) 100 MG capsule Take by mouth. (Patient not taking: Reported on 02/24/2022)    ? ibuprofen (ADVIL,MOTRIN) 200 MG tablet Take 200 mg by mouth every 6 (six) hours as needed. (Patient not taking: Reported on 02/24/2022)    ? meclizine (ANTIVERT) 25 MG tablet TAKE 1 TABLET BY MOUTH THREE TIMES DAILY AS NEEDED FOR DIZZINESS (Patient not taking: Reported on 02/24/2022)    ? oxybutynin (DITROPAN-XL) 5  MG 24 hr tablet TK 1 T PO D (Patient not taking: Reported on 02/24/2022)    ? ranitidine (ZANTAC) 150 MG capsule Take by mouth. (Patient not taking: Reported on 02/24/2022)    ? ?No current facility-administered medications for this visit.  ? ? ? ?Allergies:   Azo [phenazopyridine],  Colesevelam, Colesevelam hcl, Doxycycline hyclate, Ezetimibe, Fluoxetine, Lansoprazole, Other, Statins, Ace inhibitors, Doxycycline, Estradiol, Nsaids, and Risedronate sodium  ? ?Social History:  The patient  reports that she has never smoked. She has never used smokeless tobacco. She reports that she does not drink alcohol and does not use drugs.  ? ?Family History:   family history includes COPD in her half-brother; Cancer in her mother; Cerebral aneurysm in her father; Dementia in her half-sister.  ? ? ?Review of Systems: ?Review of Systems  ?Constitutional: Negative.   ?HENT: Negative.    ?Respiratory: Negative.    ?Cardiovascular: Negative.   ?Gastrointestinal: Negative.   ?Musculoskeletal: Negative.   ?Neurological:  Positive for dizziness.  ?Psychiatric/Behavioral: Negative.    ?All other systems reviewed and are negative. ? ? ?PHYSICAL EXAM: ?VS:  BP (!) 152/84 (BP Location: Right Arm, Patient Position: Sitting, Cuff Size: Normal)   Pulse 75   Ht '5\' 5"'$  (1.651 m)   Wt 132 lb 8 oz (60.1 kg)   SpO2 97%   BMI 22.05 kg/m?  , BMI Body mass index is 22.05 kg/m?. ?GEN: Well nourished, well developed, in no acute distress ?HEENT: normal ?Neck: no JVD, carotid bruits, or masses ?Cardiac: RRR; no murmurs, rubs, or gallops,no edema  ?Respiratory:  clear to auscultation bilaterally, normal work of breathing ?GI: soft, nontender, nondistended, + BS ?MS: no deformity or atrophy ?Skin: warm and dry, no rash ?Neuro:  Strength and sensation are intact ?Psych: euthymic mood, full affect ? ? ?Recent Labs: ?No results found for requested labs within last 8760 hours.  ? ? ?Lipid Panel ?No results found for: CHOL, HDL, LDLCALC, TRIG ?  ? ?Wt Readings from Last 3 Encounters:  ?02/24/22 132 lb 8 oz (60.1 kg)  ?07/14/16 156 lb 9.6 oz (71 kg)  ?03/12/16 154 lb (69.9 kg)  ?  ? ?ASSESSMENT AND PLAN: ? ?Problem List Items Addressed This Visit   ?None ?Visit Diagnoses   ? ? Near syncope    -  Primary  ? Primary hypertension      ? ?   ? ?Near syncope ?Etiology unclear, presenting typically when bending and walking ?Lasting for several minutes at a time and resolves without intervention ?Denies syncope ?Does not feel it is vertigo ?Unable to exclude cardiac arrhythmia ?We have ordered Zio monitor to rule out arrhythmia ?Also ordered echocardiogram in the setting of coronary disease, rule out structural heart disease ? ?Essential hypertension ?Recommend she continue monitoring blood pressure at home ?No changes to her medications, ?She reports blood pressure well controlled at home, 373 up to 428 systolic. ?running higher here ? ?Coronary calcification ?Denies anginal symptoms, no shortness of breath or chest pain on exertion ?Discussed potential need for stress testing if she develops any anginal symptoms ? ?Hyperlipidemia ?Reports she has tried statins, Zetia, has intolerance ?She does not want injection medication, PCSK9 inhibitor ? ? Total encounter time more than 60 minutes ? Greater than 50% was spent in counseling and coordination of care with the patient ? ? ? ?Signed, ?Esmond Plants, M.D., Ph.D. ?[facility] [place], [place] ?[phone] ?

## 2022-02-24 NOTE — Patient Instructions (Addendum)
Medication Instructions:  ?No changes ? ?If you need a refill on your cardiac medications before your next appointment, please call your pharmacy.  ? ? ?Lab work: ?No new labs needed ? ? ?Testing/Procedures: ? ?1) Echocardiogram: ?- Your physician has requested that you have an echocardiogram. Echocardiography is a painless test that uses sound waves to create images of your heart. It provides your doctor with information about the size and shape of your heart and how well your heart?s chambers and valves are working. This procedure takes approximately one hour. There are no restrictions for this procedure. There is a possibility that an IV may need to be started during your test to inject an image enhancing agent. This is done to obtain more optimal pictures of your heart. Therefore we ask that you do at least drink some water prior to coming in to hydrate your veins.  ? ? ?2) Heart Monitor: (to be placed after your echocardiogram is completed)  ? ?Length of wear: 14 days ?This should be mailed to your home address within 3-5 business days, If it has been more than 5 business days and you have not received your monitor, then please call the office at [phone] so we may follow up on this.  ? ?Your physician has recommended that you wear a Zio monitor.  ? ?This monitor is a medical device that records the heart?s electrical activity. Doctors most often use these monitors to diagnose arrhythmias. Arrhythmias are problems with the speed or rhythm of the heartbeat. The monitor is a small device applied to your chest. You can wear one while you do your normal daily activities. While wearing this monitor if you have any symptoms to push the button and record what you felt. Once you have worn this monitor for the period of time provider prescribed (Usually 14 days), you will return the monitor device in the postage paid box. Once it is returned they will download the data collected and provide [place] with a report which  the provider will then review and we will call you with those results. Important tips: ? ?Avoid showering during the first 24 hours of wearing the monitor. ?Avoid excessive sweating to help maximize wear time. ?Do not submerge the device, no hot tubs, and no swimming pools. ?Keep any lotions or oils away from the patch. ?After 24 hours you may shower with the patch on. Take brief showers with your back facing the shower head.  ?Do not remove patch once it has been placed because that will interrupt data and decrease adhesive wear time. ?Push the button when you have any symptoms and write down what you were feeling. ?Once you have completed wearing your monitor, remove and place into box which has postage paid and place in your outgoing mailbox.  ?If for some reason you have misplaced your box then call our office and we can provide another box and/or mail it off for you. ? ? ? ? ?Follow-Up: ?At [facility], you and your health needs are our priority.  As part of our continuing mission to provide you with exceptional heart care, we have created designated Provider Care Teams.  These Care Teams include your primary Cardiologist (physician) and Advanced Practice Providers (APPs -  Physician Assistants and Nurse Practitioners) who all work together to provide you with the care you need, when you need it. ? ?You will need a follow up appointment as needed pending your test results ? ?Providers on your designated Care Team:   ?  Murray Hodgkins, NP ?Christell Faith, PA-C ?Cadence Kathlen Mody, PA-C ? ?COVID-19 Vaccine Information can be found at: [email or web address] For questions related to vaccine distribution or appointments, please email [email or web address] or call [phone].  ? ?

## 2022-02-26 DIAGNOSIS — R55 Syncope and collapse: Secondary | ICD-10-CM

## 2022-03-17 ENCOUNTER — Ambulatory Visit (INDEPENDENT_AMBULATORY_CARE_PROVIDER_SITE_OTHER): Payer: Medicare Other

## 2022-03-17 DIAGNOSIS — I2510 Atherosclerotic heart disease of native coronary artery without angina pectoris: Secondary | ICD-10-CM | POA: Diagnosis not present

## 2022-03-17 DIAGNOSIS — R55 Syncope and collapse: Secondary | ICD-10-CM

## 2022-03-17 LAB — ECHOCARDIOGRAM COMPLETE
AR max vel: 1.58 cm2
AV Area VTI: 1.4 cm2
AV Area mean vel: 1.43 cm2
AV Mean grad: 4 mmHg
AV Peak grad: 7.6 mmHg
Ao pk vel: 1.38 m/s
Area-P 1/2: 2.87 cm2
Calc EF: 61.9 %
S' Lateral: 2.6 cm
Single Plane A2C EF: 63.2 %
Single Plane A4C EF: 58.5 %

## 2022-03-25 ENCOUNTER — Telehealth: Payer: Self-pay | Admitting: Cardiovascular Disease

## 2022-03-25 NOTE — Telephone Encounter (Signed)
Patient is calling for her monitor results.  

## 2022-03-25 NOTE — Telephone Encounter (Signed)
Called and spoke with patient.   Advised patient that Dr. Rockey Situ had not read her results yet, and that they get released to her MyChart at the same time as they get sent to MD.   Explained that MD has been busy in clinic, but as soon as he read her monitor results and sent comments and recommendations, she would be called with results.   Pt verbalized understanding and voiced appreciation for the call.

## 2022-04-01 ENCOUNTER — Telehealth: Payer: Self-pay | Admitting: Emergency Medicine

## 2022-04-01 NOTE — Telephone Encounter (Signed)
Called and spoke with patient. Results reviewed with patient, pt verbalized understanding,  questions (if any) answered.   ?

## 2022-04-01 NOTE — Telephone Encounter (Signed)
-----   Message from Minna Merritts, MD sent at 03/30/2022  5:07 PM EDT ----- Event monitor Normal sinus rhythm, no significant arrhythmia Rare episodes of tachycardia 1 every 2 to 3 days lasting less than 10 seconds No triggered events recorded

## 2022-07-23 ENCOUNTER — Encounter: Payer: Self-pay | Admitting: Ophthalmology

## 2022-07-29 NOTE — Discharge Instructions (Signed)

## 2022-08-03 ENCOUNTER — Ambulatory Visit
Admission: RE | Admit: 2022-08-03 | Discharge: 2022-08-03 | Disposition: A | Discharge status: Home / Self Care | Payer: Medicare Other | Attending: Ophthalmology | Admitting: Ophthalmology

## 2022-08-03 ENCOUNTER — Other Ambulatory Visit: Payer: Self-pay

## 2022-08-03 ENCOUNTER — Encounter
Admission: RE | Disposition: A | Discharge status: Home / Self Care | Payer: Self-pay | Source: Home / Self Care | Attending: Ophthalmology

## 2022-08-03 ENCOUNTER — Ambulatory Visit: Payer: Medicare Other | Admitting: Anesthesiology

## 2022-08-03 DIAGNOSIS — I10 Essential (primary) hypertension: Secondary | ICD-10-CM | POA: Insufficient documentation

## 2022-08-03 DIAGNOSIS — K219 Gastro-esophageal reflux disease without esophagitis: Secondary | ICD-10-CM | POA: Diagnosis not present

## 2022-08-03 DIAGNOSIS — M1990 Unspecified osteoarthritis, unspecified site: Secondary | ICD-10-CM | POA: Diagnosis not present

## 2022-08-03 DIAGNOSIS — H2511 Age-related nuclear cataract, right eye: Secondary | ICD-10-CM | POA: Diagnosis present

## 2022-08-03 DIAGNOSIS — K449 Diaphragmatic hernia without obstruction or gangrene: Secondary | ICD-10-CM | POA: Insufficient documentation

## 2022-08-03 DIAGNOSIS — N3010 Interstitial cystitis (chronic) without hematuria: Secondary | ICD-10-CM | POA: Diagnosis not present

## 2022-08-03 HISTORY — PX: CATARACT EXTRACTION W/PHACO: SHX586

## 2022-08-03 SURGERY — PHACOEMULSIFICATION, CATARACT, WITH IOL INSERTION
Anesthesia: Monitor Anesthesia Care | Site: Eye | Laterality: Right

## 2022-08-03 MED ORDER — FENTANYL CITRATE (PF) 100 MCG/2ML IJ SOLN
INTRAMUSCULAR | Status: DC | PRN
  Administered 2022-08-03 (×2): 50 ug via INTRAVENOUS

## 2022-08-03 MED ORDER — LACTATED RINGERS IV SOLN: INTRAVENOUS | Status: DC

## 2022-08-03 MED ORDER — SIGHTPATH DOSE#1 NA HYALUR & NA CHOND-NA HYALUR IO KIT
PACK | INTRAOCULAR | Status: DC | PRN
  Administered 2022-08-03: 1 via OPHTHALMIC

## 2022-08-03 MED ORDER — TETRACAINE HCL 0.5 % OP SOLN
1.0000 [drp] | OPHTHALMIC | Status: DC | PRN
  Administered 2022-08-03 (×3): 1 [drp] via OPHTHALMIC

## 2022-08-03 MED ORDER — SIGHTPATH DOSE#1 BSS IO SOLN
INTRAOCULAR | Status: DC | PRN
  Administered 2022-08-03: 15 mL via INTRAOCULAR

## 2022-08-03 MED ORDER — MOXIFLOXACIN HCL 0.5 % OP SOLN
OPHTHALMIC | Status: DC | PRN
  Administered 2022-08-03: 0.2 mL via OPHTHALMIC

## 2022-08-03 MED ORDER — SIGHTPATH DOSE#1 BSS IO SOLN
INTRAOCULAR | Status: DC | PRN
  Administered 2022-08-03: 97 mL via OPHTHALMIC

## 2022-08-03 MED ORDER — ARMC OPHTHALMIC DILATING DROPS
1.0000 | OPHTHALMIC | Status: DC | PRN
  Administered 2022-08-03 (×3): 1 via OPHTHALMIC

## 2022-08-03 MED ORDER — LIDOCAINE HCL (PF) 2 % IJ SOLN
INTRAOCULAR | Status: DC | PRN
  Administered 2022-08-03: 4 mL via INTRAOCULAR

## 2022-08-03 SURGICAL SUPPLY — 18 items
CANNULA ANT/CHMB 27G (MISCELLANEOUS) IMPLANT
CANNULA ANT/CHMB 27GA (MISCELLANEOUS) IMPLANT
CATARACT SUITE SIGHTPATH (MISCELLANEOUS) ×1 IMPLANT
DISSECTOR HYDRO NUCLEUS 50X22 (MISCELLANEOUS) ×1 IMPLANT
FEE CATARACT SUITE SIGHTPATH (MISCELLANEOUS) ×1 IMPLANT
GLOVE SURG GAMMEX PI TX LF 7.5 (GLOVE) ×1 IMPLANT
GLOVE SURG SYN 8.5  E (GLOVE) ×1
GLOVE SURG SYN 8.5 E (GLOVE) ×1 IMPLANT
GLOVE SURG SYN 8.5 PF PI (GLOVE) ×1 IMPLANT
LENS CLAREON VIVITY 21.0 ×1 IMPLANT
LENS IOL CLRN VT YLW 21.0 IMPLANT
NDL FILTER BLUNT 18X1 1/2 (NEEDLE) ×1 IMPLANT
NEEDLE FILTER BLUNT 18X1 1/2 (NEEDLE) ×1 IMPLANT
SPONGE SURG I SPEAR (MISCELLANEOUS) IMPLANT
STENT OPTH STRL GLAUCOMA IMPLANT
SYR 3ML LL SCALE MARK (SYRINGE) ×1 IMPLANT
SYR 5ML LL (SYRINGE) ×1 IMPLANT
WATER STERILE IRR 250ML POUR (IV SOLUTION) ×1 IMPLANT

## 2022-08-03 NOTE — Anesthesia Postprocedure Evaluation (Signed)
Anesthesia Post Note  Patient: Sara Jackson  Procedure(s) Performed: CATARACT EXTRACTION PHACO AND INTRAOCULAR LENS PLACEMENT (IOC) RIGHT CLARION VIVITY TORIC  LENS  4.49 00:31.4 (Right: Eye)  Patient location: Phase II. Anesthesia Type: MAC Level of consciousness: awake and alert Pain management: pain level controlled Vital Signs Assessment: post-procedure vital signs reviewed and stable Respiratory status: spontaneous breathing, nonlabored ventilation, respiratory function stable and patient connected to nasal cannula oxygen Cardiovascular status: stable and blood pressure returned to baseline Postop Assessment: no apparent nausea or vomiting Anesthetic complications: no   There were no known notable events for this encounter.   Last Vitals:  Vitals:   08/03/22 1152 08/03/22 1200  BP: (!) 148/65 (!) 152/64  Pulse: 78 70  Resp: 18 19  Temp: 36.4 C (!) 36.4 C  SpO2: 99% 98%    Last Pain:  Vitals:   08/03/22 1200  TempSrc:   PainSc: 0-No pain                 Precious Haws Kerby Borner

## 2022-08-03 NOTE — Anesthesia Preprocedure Evaluation (Addendum)
Anesthesia Evaluation  Patient identified by MRN, date of birth, ID band Patient awake    Reviewed: Allergy & Precautions, NPO status , Patient's Chart, lab work & pertinent test results  History of Anesthesia Complications Negative for: history of anesthetic complications  Airway Mallampati: III  TM Distance: <3 FB Neck ROM: full    Dental  (+) Chipped   Pulmonary neg pulmonary ROS, neg shortness of breath,    Pulmonary exam normal        Cardiovascular Exercise Tolerance: Good hypertension, (-) anginaNormal cardiovascular exam     Neuro/Psych  Neuromuscular disease negative psych ROS   GI/Hepatic Neg liver ROS, hiatal hernia, GERD  Controlled,  Endo/Other  negative endocrine ROS  Renal/GU      Musculoskeletal  (+) Arthritis ,   Abdominal   Peds  Hematology negative hematology ROS (+)   Anesthesia Other Findings Past Medical History: No date: Abdominal pain 07/04/2015: AP (abdominal pain) No date: Arthritis     Comment:  hips, lower back 07/11/2014: Benign paroxysmal positional nystagmus No date: Bladder pain 05/02/2014: Bursitis, trochanteric No date: Chronic interstitial cystitis No date: Cystocele 07/04/2015: Cystocele, midline 07/11/2014: Essential (primary) hypertension No date: GERD (gastroesophageal reflux disease) 07/11/2014: Glaucoma 07/11/2014: Hypercholesteremia 10/18/2014: Impingement syndrome of shoulder 05/02/2014: LBP (low back pain) 04/25/2014: 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[place]) No date: Vaginal atrophy  Past Surgical History: No date: BREAST BIOPSY; Left 03/12/2016: ESOPHAGOGASTRODUODENOSCOPY (EGD) WITH PROPOFOL; N/A     Comment:  Procedure: ESOPHAGOGASTRODUODENOSCOPY (EGD) WITH               PROPOFOL with dilation;  Surgeon: Lucilla Lame, MD;                Location: [place];  Service: Endoscopy;                Laterality: N/A; No date: MELANOMA EXCISION No date: RADICAL  HYSTERECTOMY  BMI    Body Mass Index: 21.80 kg/m      Reproductive/Obstetrics negative OB ROS                            Anesthesia Physical Anesthesia Plan  ASA: 3  Anesthesia Plan: MAC   Post-op Pain Management:    Induction: Intravenous  PONV Risk Score and Plan:   Airway Management Planned: Natural Airway and Nasal Cannula  Additional Equipment:   Intra-op Plan:   Post-operative Plan:   Informed Consent: I have reviewed the patients History and Physical, chart, labs and discussed the procedure including the risks, benefits and alternatives for the proposed anesthesia with the patient or authorized representative who has indicated his/her understanding and acceptance.     Dental Advisory Given  Plan Discussed with: Anesthesiologist, CRNA and Surgeon  Anesthesia Plan Comments: (Patient consented for risks of anesthesia including but not limited to:  - adverse reactions to medications - damage to eyes, teeth, lips or other oral mucosa - nerve damage due to positioning  - sore throat or hoarseness - Damage to heart, brain, nerves, lungs, other parts of body or loss of life  Patient voiced understanding.)        Anesthesia Quick Evaluation

## 2022-08-03 NOTE — Transfer of Care (Signed)
Immediate Anesthesia Transfer of Care Note  Patient: Sara Jackson  Procedure(s) Performed: CATARACT EXTRACTION PHACO AND INTRAOCULAR LENS PLACEMENT (IOC) RIGHT CLARION VIVITY TORIC  LENS  4.49 00:31.4 (Right: Eye)  Patient Location: PACU  Anesthesia Type: MAC  Level of Consciousness: awake, alert  and patient cooperative  Airway and Oxygen Therapy: Patient Spontanous Breathing and Patient connected to supplemental oxygen  Post-op Assessment: Post-op Vital signs reviewed, Patient's Cardiovascular Status Stable, Respiratory Function Stable, Patent Airway and No signs of Nausea or vomiting  Post-op Vital Signs: Reviewed and stable  Complications: There were no known notable events for this encounter.

## 2022-08-03 NOTE — Op Note (Signed)
OPERATIVE NOTE  Sara Jackson [id number] 08/03/2022   PREOPERATIVE DIAGNOSIS:  Nuclear sclerotic cataract right eye.  H25.11   POSTOPERATIVE DIAGNOSIS:    Nuclear sclerotic cataract right eye.     PROCEDURE:  Phacoemusification with posterior chamber intraocular lens placement of the right eye   LENS:   Implant Name Type Inv. Item Serial No. Manufacturer Lot No. LRB No. Used Action  STENT OPTH STRL GLAUCOMA - [id number]  STENT OPTH STRL GLAUCOMA  [employer] [id number] Right 1 Wasted  Clareon Vivity Toric 21.0  Intraocular Lens  [id number] ALCON  Right 1 Implanted       Procedure(s): CATARACT EXTRACTION PHACO AND INTRAOCULAR LENS PLACEMENT (IOC) RIGHT CLARION VIVITY TORIC  LENS  4.49 00:31.4 (Right)  CNWET3 +21.0 D toric vivity clareon lens, rotated to 158 degrees with manual toric markers.   ULTRASOUND TIME: 0 minutes 31 seconds.  CDE 4.49   SURGEON:  Benay Pillow, MD, MPH  ANESTHESIOLOGIST: Anesthesiologist: Piscitello, Precious Haws, MD   ANESTHESIA:  Topical with tetracaine drops augmented with 1% preservative-free intracameral lidocaine.  ESTIMATED BLOOD LOSS: less than 1 mL.   COMPLICATIONS:  None.   DESCRIPTION OF PROCEDURE:  The patient was identified in the holding room and transported to the operating room and placed in the supine position under the operating microscope.  The right eye was identified as the operative eye and it was prepped and draped in the usual sterile ophthalmic fashion.  The verion system could not be registered, the clareon lens selected was not uploaded/available to the verion device.   A 1.0 millimeter clear-corneal paracentesis was made at the 10:30 position and at 7:30 for the hydrus. 0.5 ml of preservative-free 1% lidocaine with epinephrine was injected into the anterior chamber.    The anterior chamber was filled with viscoelastic.  A 2.4 millimeter keratome was used to make a near-clear corneal incision at the 8:00 position.    Attention was turned to the hydrus.  The patients head was turned to the left and the microscope was tilted to 035 degrees.  Ocular instruments/Glaukos OAL/H2 gonioprism was used coupled with viscoelastic on the cornea was used to visualize the trabecular meshwork. The hydrus was opened and introduced into the eye.  The meshwork was engaged with the tip of the injector and the hydrus stent was attempted to be deployed into Schlemm's canal at 4:00, but this was met with significant resistance.  A second attempt was made, so the stent was removed from the eye.  A manual marker was used to mark the axis at 158 degrees.  A curvilinear capsulorrhexis was made with a cystotome and capsulorrhexis forceps.  Balanced salt solution was used to hydrodissect and hydrodelineate the nucleus.   Phacoemulsification was then used in stop and chop fashion to remove the lens nucleus and epinucleus.  The remaining cortex was then removed using the irrigation and aspiration handpiece. Viscoelastic was then placed into the capsular bag to distend it for lens placement.  A lens was then injected into the capsular bag.  The remaining viscoelastic was aspirated.  The lens was rotated to align with the marks made at 158 degrees.   Wounds were hydrated with balanced salt solution.  The anterior chamber was inflated to a physiologic pressure with balanced salt solution. The lens was well centered.  Intracameral vigamox 0.1 mL undiluted was injected into the eye and a drop placed onto the ocular surface.  No wound leaks were noted.  The patient was taken to the  recovery room in stable condition without complications of anesthesia or surgery  Benay Pillow 08/03/2022, 11:51 AM

## 2022-08-03 NOTE — H&P (Signed)
[facility]   Primary Care Physician:  [facility] [place] Ophthalmologist: Dr. Benay Pillow  Pre-Procedure History & Physical: HPI:  Sara Jackson is a 81 y.o. female here for cataract surgery.   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 Authorizing Provider  acetaminophen (TYLENOL) 325 MG tablet Take by mouth. 07/11/14  Yes [provider]  Cholecalciferol (VITAMIN D PO) Take by mouth.   Yes [provider]  estradiol (ESTRACE) 1 MG tablet Take 0.5 mg by mouth.  02/24/16  Yes [provider]  latanoprost (XALATAN) 0.005 % ophthalmic solution 1 drop at bedtime.   Yes [provider]  lisinopril-hydrochlorothiazide (PRINZIDE,ZESTORETIC) 20-25 MG per tablet Take by mouth. Takes 1/2 tablet 07/11/14  Yes [provider]  Multiple Vitamins-Minerals (CENTRUM SILVER PO) Take by mouth daily.   Yes [provider]  pantoprazole (PROTONIX) 40 MG tablet  Take 20 mg by mouth daily.   Yes [provider]  PREMARIN vaginal cream PLACE 0.5 GRAMS VAGINALLY ONCE D 06/25/16  Yes [provider]    Allergies as of 07/08/2022 - Review Complete 02/24/2022  Allergen Reaction Noted   Azo [phenazopyridine] Itching 06/20/2015   Colesevelam Diarrhea    Colesevelam hcl Diarrhea 07/04/2015   Doxycycline hyclate Other (See Comments)    Ezetimibe Diarrhea 07/04/2015   Fluoxetine Other (See Comments) 10/30/2015   Lansoprazole Diarrhea 07/04/2015   Other Other (See Comments) 02/24/2022   Statins Diarrhea and Other (See Comments) 06/20/2015   Ace inhibitors Rash 07/04/2015   Doxycycline Rash 07/04/2015   Estradiol Rash and Other (See Comments) 07/04/2015   Nsaids Rash 07/04/2015   Risedronate sodium Rash 07/04/2015    Family History  Problem Relation Age of Onset   Cancer Mother    Cerebral aneurysm Father    COPD Half-Brother    Dementia Half-Sister    Kidney cancer Neg Hx    Bladder Cancer Neg Hx     Social History   Socioeconomic History   Marital status: Married    Spouse name: Not on file   Number of children: Not on file   Years of education: Not on file   Highest education level: Not on file  Occupational History   Not on file  Tobacco Use   Smoking status: Never   Smokeless tobacco: Never  Vaping Use   Vaping Use: Never used  Substance and Sexual Activity   Alcohol use: No    Alcohol/week: 0.0 standard drinks  of alcohol   Drug use: No   Sexual activity: Not on file  Other Topics Concern   Not on file  Social History Narrative   Not on file   Social Determinants of Health   Financial Resource Strain: Not on file  Food Insecurity: Not on file  Transportation Needs: Not on file  Physical Activity: Not on file  Stress: Not on file  Social Connections: Not on file  Intimate Partner Violence: Not on file    Review of Systems: See HPI, otherwise negative ROS  Physical Exam: BP (!) 152/68   Pulse 83    Temp (!) 97.3 F (36.3 C) (Temporal)   Resp 16   Ht 5' 5.5" (1.664 m)   Wt 60.3 kg   SpO2 98%   BMI 21.80 kg/m  General:   Alert, cooperative in NAD Head:  Normocephalic and atraumatic. Respiratory:  Normal work of breathing. Cardiovascular:  RRR  Impression/Plan: Sara Jackson is here for cataract surgery.  Risks, benefits, limitations, and alternatives regarding cataract surgery have been reviewed with the patient.  Questions have been answered.  All parties agreeable.   Benay Pillow, MD  08/03/2022, 11:08 AM

## 2022-08-04 ENCOUNTER — Other Ambulatory Visit: Payer: Self-pay

## 2022-08-05 ENCOUNTER — Encounter: Payer: Self-pay | Admitting: Ophthalmology

## 2022-08-12 NOTE — Discharge Instructions (Signed)

## 2022-08-17 ENCOUNTER — Encounter: Payer: Self-pay | Admitting: Ophthalmology

## 2022-08-17 ENCOUNTER — Ambulatory Visit: Payer: Medicare Other | Admitting: Anesthesiology

## 2022-08-17 ENCOUNTER — Other Ambulatory Visit: Payer: Self-pay

## 2022-08-17 ENCOUNTER — Ambulatory Visit
Admission: RE | Admit: 2022-08-17 | Discharge: 2022-08-17 | Disposition: A | Discharge status: Home / Self Care | Payer: Medicare Other | Attending: Ophthalmology | Admitting: Ophthalmology

## 2022-08-17 ENCOUNTER — Encounter
Admission: RE | Disposition: A | Discharge status: Home / Self Care | Payer: Self-pay | Source: Home / Self Care | Attending: Ophthalmology

## 2022-08-17 DIAGNOSIS — I10 Essential (primary) hypertension: Secondary | ICD-10-CM | POA: Insufficient documentation

## 2022-08-17 DIAGNOSIS — H2181 Floppy iris syndrome: Secondary | ICD-10-CM | POA: Diagnosis not present

## 2022-08-17 DIAGNOSIS — K219 Gastro-esophageal reflux disease without esophagitis: Secondary | ICD-10-CM | POA: Insufficient documentation

## 2022-08-17 DIAGNOSIS — H2512 Age-related nuclear cataract, left eye: Secondary | ICD-10-CM | POA: Insufficient documentation

## 2022-08-17 DIAGNOSIS — M1990 Unspecified osteoarthritis, unspecified site: Secondary | ICD-10-CM | POA: Diagnosis not present

## 2022-08-17 DIAGNOSIS — K449 Diaphragmatic hernia without obstruction or gangrene: Secondary | ICD-10-CM | POA: Diagnosis not present

## 2022-08-17 HISTORY — PX: CATARACT EXTRACTION W/PHACO: SHX586

## 2022-08-17 SURGERY — PHACOEMULSIFICATION, CATARACT, WITH IOL INSERTION
Anesthesia: Monitor Anesthesia Care | Site: Eye | Laterality: Left

## 2022-08-17 MED ORDER — PHENYLEPHRINE HCL (PRESSORS) 10 MG/ML IV SOLN
INTRAVENOUS | Status: DC | PRN
  Administered 2022-08-17 (×2): 50 ug via INTRAVENOUS

## 2022-08-17 MED ORDER — ACETYLCHOLINE CHLORIDE 20 MG IO SOLR
INTRAOCULAR | Status: DC | PRN
  Administered 2022-08-17: 20 mg via INTRAOCULAR

## 2022-08-17 MED ORDER — LIDOCAINE HCL (PF) 2 % IJ SOLN
INTRAOCULAR | Status: DC | PRN
  Administered 2022-08-17: 1 mL via INTRAOCULAR

## 2022-08-17 MED ORDER — MIDAZOLAM HCL 2 MG/2ML IJ SOLN
INTRAMUSCULAR | Status: DC | PRN
  Administered 2022-08-17 (×2): 1 mg via INTRAVENOUS

## 2022-08-17 MED ORDER — MOXIFLOXACIN HCL 0.5 % OP SOLN
OPHTHALMIC | Status: DC | PRN
  Administered 2022-08-17: 0.2 mL via OPHTHALMIC

## 2022-08-17 MED ORDER — SIGHTPATH DOSE#1 BSS IO SOLN
INTRAOCULAR | Status: DC | PRN
  Administered 2022-08-17: 97 mL via OPHTHALMIC

## 2022-08-17 MED ORDER — BRIMONIDINE TARTRATE-TIMOLOL 0.2-0.5 % OP SOLN
OPHTHALMIC | Status: DC | PRN
  Administered 2022-08-17: 1 [drp] via OPHTHALMIC

## 2022-08-17 MED ORDER — TETRACAINE HCL 0.5 % OP SOLN
1.0000 [drp] | OPHTHALMIC | Status: DC | PRN
  Administered 2022-08-17 (×3): 1 [drp] via OPHTHALMIC

## 2022-08-17 MED ORDER — SIGHTPATH DOSE#1 BSS IO SOLN
INTRAOCULAR | Status: DC | PRN
  Administered 2022-08-17: 15 mL

## 2022-08-17 MED ORDER — FENTANYL CITRATE (PF) 100 MCG/2ML IJ SOLN
INTRAMUSCULAR | Status: DC | PRN
  Administered 2022-08-17 (×2): 50 ug via INTRAVENOUS

## 2022-08-17 MED ORDER — SIGHTPATH DOSE#1 NA HYALUR & NA CHOND-NA HYALUR IO KIT
PACK | INTRAOCULAR | Status: DC | PRN
  Administered 2022-08-17: 1 via OPHTHALMIC

## 2022-08-17 MED ORDER — ARMC OPHTHALMIC DILATING DROPS
1.0000 | OPHTHALMIC | Status: DC | PRN
  Administered 2022-08-17 (×3): 1 via OPHTHALMIC

## 2022-08-17 SURGICAL SUPPLY — 20 items
CANNULA ANT/CHMB 27G (MISCELLANEOUS) IMPLANT
CANNULA ANT/CHMB 27GA (MISCELLANEOUS) IMPLANT
CATARACT SUITE SIGHTPATH (MISCELLANEOUS) ×1 IMPLANT
DISSECTOR HYDRO NUCLEUS 50X22 (MISCELLANEOUS) ×1 IMPLANT
FEE CATARACT SUITE SIGHTPATH (MISCELLANEOUS) ×1 IMPLANT
GLOVE SURG GAMMEX PI TX LF 7.5 (GLOVE) ×1 IMPLANT
GLOVE SURG SYN 8.5  E (GLOVE) ×1
GLOVE SURG SYN 8.5 E (GLOVE) ×1 IMPLANT
GLOVE SURG SYN 8.5 PF PI (GLOVE) ×1 IMPLANT
LENS CLARION VIVITY TORIC 21.0 ×1 IMPLANT
LENS IOL CLRN VT TRC 3 21.0 IMPLANT
NDL FILTER BLUNT 18X1 1/2 (NEEDLE) ×1 IMPLANT
NEEDLE FILTER BLUNT 18X1 1/2 (NEEDLE) ×1 IMPLANT
PACK VIT ANT 23G (MISCELLANEOUS) IMPLANT
RING MALYGIN (MISCELLANEOUS) IMPLANT
SUT ETHILON 10-0 CS-B-6CS-B-6 (SUTURE)
SUTURE EHLN 10-0 CS-B-6CS-B-6 (SUTURE) IMPLANT
SYR 3ML LL SCALE MARK (SYRINGE) ×1 IMPLANT
SYR 5ML LL (SYRINGE) ×1 IMPLANT
WATER STERILE IRR 250ML POUR (IV SOLUTION) ×1 IMPLANT

## 2022-08-17 NOTE — H&P (Signed)
[facility]   Primary Care Physician:  [facility] [place] Ophthalmologist: Dr. Benay Pillow  Pre-Procedure History & Physical: HPI:  Sara Jackson is a 81 y.o. female here for cataract surgery.   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   Vaginal atrophy     Past Surgical History:  Procedure Laterality Date   BREAST BIOPSY Left    CATARACT EXTRACTION W/PHACO Right 08/03/2022   Procedure: CATARACT EXTRACTION PHACO AND INTRAOCULAR LENS PLACEMENT ([place]) RIGHT CLARION VIVITY TORIC  LENS  4.49 00:31.4;  Surgeon: Eulogio Bear, MD;  Location: [place];  Service: Ophthalmology;  Laterality: Right;   ESOPHAGOGASTRODUODENOSCOPY (EGD) WITH PROPOFOL N/A 03/12/2016   Procedure: ESOPHAGOGASTRODUODENOSCOPY (EGD) WITH PROPOFOL with dilation;  Surgeon: Lucilla Lame, MD;  Location: [place];  Service: Endoscopy;  Laterality: N/A;   MELANOMA EXCISION     RADICAL HYSTERECTOMY      Prior to Admission medications   Medication Sig Start Date End Date Taking? Authorizing Provider  acetaminophen (TYLENOL) 325 MG tablet Take by mouth. 07/11/14  Yes [provider]  Cholecalciferol (VITAMIN D PO) Take by mouth.   Yes [provider]  estradiol (ESTRACE) 1 MG tablet Take 0.5 mg by mouth.  02/24/16  Yes [provider]  latanoprost (XALATAN) 0.005 % ophthalmic solution 1 drop at bedtime.   Yes [provider]   lisinopril-hydrochlorothiazide (PRINZIDE,ZESTORETIC) 20-25 MG per tablet Take by mouth. Takes 1/2 tablet 07/11/14  Yes [provider]  Multiple Vitamins-Minerals (CENTRUM SILVER PO) Take by mouth daily.   Yes [provider]  pantoprazole (PROTONIX) 40 MG tablet Take 20 mg by mouth daily.   Yes [provider]  PREMARIN vaginal cream PLACE 0.5 GRAMS VAGINALLY ONCE D 06/25/16  Yes [provider]    Allergies as of 07/08/2022 - Review Complete 02/24/2022  Allergen Reaction Noted   Azo [phenazopyridine] Itching 06/20/2015   Colesevelam Diarrhea    Colesevelam hcl Diarrhea 07/04/2015   Doxycycline hyclate Other (See Comments)    Ezetimibe Diarrhea 07/04/2015   Fluoxetine Other (See Comments) 10/30/2015   Lansoprazole Diarrhea 07/04/2015   Other Other (See Comments) 02/24/2022   Statins Diarrhea and Other (See Comments) 06/20/2015   Ace inhibitors Rash 07/04/2015   Doxycycline Rash 07/04/2015   Estradiol Rash and Other (See Comments) 07/04/2015   Nsaids Rash 07/04/2015   Risedronate sodium Rash 07/04/2015    Family History  Problem Relation Age of Onset   Cancer Mother    Cerebral aneurysm Father    COPD Half-Brother    Dementia Half-Sister    Kidney cancer Neg Hx    Bladder Cancer Neg Hx     Social History   Socioeconomic History   Marital status: Married    Spouse name: Not on file   Number of children: Not on file   Years of education: Not on file   Highest education level: Not on file  Occupational History  Not on file  Tobacco Use   Smoking status: Never   Smokeless tobacco: Never  Vaping Use   Vaping Use: Never used  Substance and Sexual Activity   Alcohol use: No    Alcohol/week: 0.0 standard drinks of alcohol   Drug use: No   Sexual activity: Not on file  Other Topics Concern   Not on file  Social History Narrative   Not on file   Social Determinants of Health   Financial Resource Strain: Not on file  Food  Insecurity: Not on file  Transportation Needs: Not on file  Physical Activity: Not on file  Stress: Not on file  Social Connections: Not on file  Intimate Partner Violence: Not on file    Review of Systems: See HPI, otherwise negative ROS  Physical Exam: BP 132/65   Temp (!) 97.1 F (36.2 C) (Tympanic)   Resp 13   Ht 5' 5.51" (1.664 m)   Wt 60.2 kg   SpO2 98%   BMI 21.76 kg/m  General:   Alert, cooperative in NAD Head:  Normocephalic and atraumatic. Respiratory:  Normal work of breathing. Cardiovascular:  RRR  Impression/Plan: Sara Jackson is here for cataract surgery.  Risks, benefits, limitations, and alternatives regarding cataract surgery have been reviewed with the patient.  Questions have been answered.  All parties agreeable.   Benay Pillow, MD  08/17/2022, 10:44 AM

## 2022-08-17 NOTE — Op Note (Signed)
OPERATIVE NOTE  Sara Jackson [id number] 08/17/2022   PREOPERATIVE DIAGNOSIS:  Nuclear sclerotic cataract left eye.  H25.12   POSTOPERATIVE DIAGNOSIS:      Nuclear sclerotic cataract left eye.   Intraoperative floppy iris syndrome.   PROCEDURE:  Phacoemusification with posterior chamber intraocular lens placement of the left eye   LENS:   Implant Name Type Inv. Item Serial No. Manufacturer Lot No. LRB No. Used Action  CLAREON VIVITY TORIC IOL Intraocular Lens  [id number] ALCON  Left 1 Implanted      Procedure(s) with comments: CATARACT EXTRACTION PHACO AND INTRAOCULAR LENS PLACEMENT (IOC) LEFT CLARION VIVITY TORIC LENS HYDRUS MICROSTENT (Left) - 4.49 0:37.0  CNWET3 +21.0 lens.   SURGEON:  Benay Pillow, MD, MPH   ANESTHESIA:  Topical with tetracaine drops augmented with 1% preservative-free intracameral lidocaine.  ESTIMATED BLOOD LOSS: <1 mL   COMPLICATIONS:  Iris prolapse.   DESCRIPTION OF PROCEDURE:  The patient was identified in the holding room and transported to the operating room and placed in the supine position under the operating microscope.  The left eye was identified as the operative eye and it was prepped and draped in the usual sterile ophthalmic fashion.  The verion system was registered without difficulty.   A 1.0 millimeter clear-corneal paracentesis was made at the 5:00 position. 0.5 ml of preservative-free 1% lidocaine with epinephrine was injected into the anterior chamber.  The anterior chamber was filled with viscoelastic.  A 2.4 millimeter keratome was used to make a near-clear corneal incision at the 2:00 position.  A curvilinear capsulorrhexis was made with a cystotome and capsulorrhexis forceps.  Balanced salt solution was used to hydrodissect and hydrodelineate the nucleus.   Phacoemulsification was then used in stop and chop fashion to remove the lens nucleus and epinucleus.  The remaining cortex was then removed using the irrigation and  aspiration handpiece. Viscoelastic was then placed into the capsular bag to distend it for lens placement.  A lens was then injected into the capsular bag.  The remaining viscoelastic was aspirated.  The lens was rotated with guidance from the [place] system.  There was some moderate positive posterior pressure throughout the case.  The patient did not complain of pain, and there was no change in the red reflex.   Wounds were hydrated with balanced salt solution.  The anterior chamber was inflated to a physiologic pressure with balanced salt solution.  At the time of hydrating the paracentesis, the iris prolapsed out.  The pressure was lowered by burping the paracentesis and the iris was carefully reposited with a small amount of viscoat to help maintain it position.  Miochol was injected into the eye through the paracentesis. The lens remained well centered and rotated throughout.  Intracameral vigamox 0.1 mL undiltued was injected into the eye and a drop placed onto the ocular surface.   No wound leaks were noted.  Combigan drops were placed on the eye. The patient was taken to the recovery room in stable condition without complications of anesthesia or surgery  Benay Pillow 08/17/2022, 11:28 AM

## 2022-08-17 NOTE — Anesthesia Preprocedure Evaluation (Addendum)
Anesthesia Evaluation  Patient identified by MRN, date of birth, ID band Patient awake    Reviewed: Allergy & Precautions, NPO status , Patient's Chart, lab work & pertinent test results  History of Anesthesia Complications Negative for: history of anesthetic complications  Airway Mallampati: I   Neck ROM: Full    Dental no notable dental hx.    Pulmonary neg pulmonary ROS,    Pulmonary exam normal breath sounds clear to auscultation       Cardiovascular hypertension, Normal cardiovascular exam Rhythm:Regular Rate:Normal     Neuro/Psych Chronic back pain    GI/Hepatic hiatal hernia, GERD  ,  Endo/Other  negative endocrine ROS  Renal/GU negative Renal ROS     Musculoskeletal  (+) Arthritis ,   Abdominal   Peds  Hematology negative hematology ROS (+)   Anesthesia Other Findings   Reproductive/Obstetrics                            Anesthesia Physical Anesthesia Plan  ASA: 2  Anesthesia Plan: MAC   Post-op Pain Management:    Induction: Intravenous  PONV Risk Score and Plan: 2 and Treatment may vary due to age or medical condition, Midazolam and TIVA  Airway Management Planned: Natural Airway and Nasal Cannula  Additional Equipment:   Intra-op Plan:   Post-operative Plan:   Informed Consent: I have reviewed the patients History and Physical, chart, labs and discussed the procedure including the risks, benefits and alternatives for the proposed anesthesia with the patient or authorized representative who has indicated his/her understanding and acceptance.     Dental advisory given  Plan Discussed with: CRNA  Anesthesia Plan Comments: (LMA/GETA backup discussed.  Patient consented for risks of anesthesia including but not limited to:  - adverse reactions to medications - damage to eyes, teeth, lips or other oral mucosa - nerve damage due to positioning  - sore throat or  hoarseness - damage to heart, brain, nerves, lungs, other parts of body or loss of life  Informed patient about role of CRNA in peri- and intra-operative care.  Patient voiced understanding.)        Anesthesia Quick Evaluation

## 2022-08-17 NOTE — Anesthesia Postprocedure Evaluation (Signed)
Anesthesia Post Note  Patient: Sara Jackson  Procedure(s) Performed: CATARACT EXTRACTION PHACO AND INTRAOCULAR LENS PLACEMENT (IOC) LEFT CLARION VIVITY TORIC LENS HYDRUS MICROSTENT (Left: Eye)  Patient location during evaluation: PACU Anesthesia Type: MAC Level of consciousness: awake and alert, oriented and patient cooperative Pain management: pain level controlled Vital Signs Assessment: post-procedure vital signs reviewed and stable Respiratory status: spontaneous breathing, nonlabored ventilation and respiratory function stable Cardiovascular status: blood pressure returned to baseline and stable Postop Assessment: adequate PO intake Anesthetic complications: no   No notable events documented.   Last Vitals:  Vitals:   08/17/22 1129 08/17/22 1136  BP: 105/60 96/60  Pulse: 72 76  Resp: 18 16  Temp: (!) 36.3 C (!) 36.4 C  SpO2: 97% 98%    Last Pain:  Vitals:   08/17/22 1136  TempSrc:   PainSc: 0-No pain                 Darrin Nipper

## 2022-08-17 NOTE — Transfer of Care (Signed)
Immediate Anesthesia Transfer of Care Note  Patient: Sara Jackson  Procedure(s) Performed: CATARACT EXTRACTION PHACO AND INTRAOCULAR LENS PLACEMENT (IOC) LEFT CLARION VIVITY TORIC LENS HYDRUS MICROSTENT (Left: Eye)  Patient Location: PACU  Anesthesia Type: MAC  Level of Consciousness: awake, alert  and patient cooperative  Airway and Oxygen Therapy: Patient Spontanous Breathing and Patient connected to supplemental oxygen  Post-op Assessment: Post-op Vital signs reviewed, Patient's Cardiovascular Status Stable, Respiratory Function Stable, Patent Airway and No signs of Nausea or vomiting  Post-op Vital Signs: Reviewed and stable  Complications: No notable events documented.

## 2022-08-24 ENCOUNTER — Encounter: Payer: Self-pay | Admitting: Ophthalmology

## 2023-03-04 ENCOUNTER — Ambulatory Visit
Admission: EM | Admit: 2023-03-04 | Discharge: 2023-03-04 | Disposition: A | Discharge status: Home / Self Care | Payer: Medicare Other | Attending: Family Medicine | Admitting: Family Medicine

## 2023-03-04 ENCOUNTER — Ambulatory Visit (INDEPENDENT_AMBULATORY_CARE_PROVIDER_SITE_OTHER): Payer: Medicare Other

## 2023-03-04 DIAGNOSIS — R109 Unspecified abdominal pain: Secondary | ICD-10-CM | POA: Diagnosis not present

## 2023-03-04 DIAGNOSIS — R0781 Pleurodynia: Secondary | ICD-10-CM

## 2023-03-04 DIAGNOSIS — K529 Noninfective gastroenteritis and colitis, unspecified: Secondary | ICD-10-CM

## 2023-03-04 MED ORDER — TIZANIDINE HCL 4 MG PO TABS: 4.0000 mg | ORAL_TABLET | Freq: Four times a day (QID) | ORAL | 0 refills | Status: DC | PRN

## 2023-03-04 NOTE — ED Provider Notes (Signed)
[facility]    CSN: [id number] Arrival date & time: 03/04/23  0803      History   Chief Complaint Chief Complaint  Patient presents with   Back Pain   Diarrhea    HPI Sara Jackson is a 82 y.o. female.   HPI   Sara Jackson presents for right sided rib pain for the past year and has been worse in the last 3 weeks.  Took Tylenol and used a heating pad with some relief. She belches a lot (10 or more times a day). No fever, cough, shortness of breath. Has lower pain underneath both breasts. She doesn't wear a bra due to her pain.  Pain is worse when she bends over. She has acid reflux and takes medication twice a day with some relief.   She had diarrhea where she has 4 bowel movements before it stops. Believes it may be related She has never had constipation or needed a laxative. 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   Vaginal atrophy     Patient Active Problem List   Diagnosis Date Noted   Problems with swallowing and mastication    Hiatal hernia    Stricture and stenosis of esophagus    Yeast vaginitis 07/09/2015   AP (abdominal pain) 07/04/2015   Vaginal atrophy 07/04/2015   Bladder pain 07/04/2015   Cystocele, midline 07/04/2015   IC (interstitial cystitis) 07/04/2015   Pain in thoracic spine 02/21/2015   Impingement syndrome of shoulder 10/18/2014   Benign paroxysmal positional nystagmus  07/11/2014   Essential (primary) hypertension 07/11/2014   Gastro-esophageal reflux disease without esophagitis 07/11/2014   Glaucoma 07/11/2014   Hypercholesteremia 07/11/2014   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   LBP (low back pain) 05/02/2014   Bursitis, trochanteric 05/02/2014   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    Past Surgical History:  Procedure Laterality Date   BREAST BIOPSY Left    CATARACT EXTRACTION W/PHACO Right 08/03/2022   Procedure: CATARACT EXTRACTION PHACO AND INTRAOCULAR LENS PLACEMENT (IOC) RIGHT CLARION VIVITY TORIC  LENS  4.49 00:31.4;  Surgeon: Nevada Crane, MD;  Location: [facility];  Service: Ophthalmology;  Laterality: Right;   CATARACT EXTRACTION W/PHACO Left 08/17/2022   Procedure: CATARACT EXTRACTION PHACO AND INTRAOCULAR LENS PLACEMENT (IOC) LEFT CLARION VIVITY TORIC LENS HYDRUS MICROSTENT;  Surgeon: Nevada Crane, MD;  Location: [facility];  Service: Ophthalmology;  Laterality: Left;  4.49 0:37.0   ESOPHAGOGASTRODUODENOSCOPY (EGD) WITH PROPOFOL N/A 03/12/2016   Procedure: ESOPHAGOGASTRODUODENOSCOPY (EGD) WITH PROPOFOL with dilation;  Surgeon: Midge Minium, MD;  Location: [facility];  Service: Endoscopy;  Laterality: N/A;   MELANOMA EXCISION     RADICAL HYSTERECTOMY      OB History   No obstetric history on file.      Home Medications    Prior to Admission medications   Medication Sig Start Date End  Date Taking? Authorizing Provider  acetaminophen (TYLENOL) 325 MG tablet Take by mouth. 07/11/14  Yes [provider]  Cholecalciferol (VITAMIN D PO) Take by mouth.   Yes [provider]  estradiol (ESTRACE) 1 MG tablet Take 0.5 mg by mouth.  02/24/16  Yes [provider]  latanoprost (XALATAN) 0.005 % ophthalmic solution 1 drop at bedtime.   Yes [provider]  lisinopril-hydrochlorothiazide (PRINZIDE,ZESTORETIC) 20-25 MG per tablet Take by mouth. Takes 1/2 tablet 07/11/14   Yes [provider]  Multiple Vitamins-Minerals (CENTRUM SILVER PO) Take by mouth daily.   Yes [provider]  pantoprazole (PROTONIX) 40 MG tablet Take 20 mg by mouth daily.   Yes [provider]  PREMARIN vaginal cream PLACE 0.5 GRAMS VAGINALLY ONCE D 06/25/16  Yes [provider]  tiZANidine (ZANAFLEX) 4 MG tablet Take 1 tablet (4 mg total) by mouth every 6 (six) hours as needed for muscle spasms. 03/04/23  Yes Katha Cabal, DO    Family History Family History  Problem Relation Age of Onset   Cancer Mother    Cerebral aneurysm Father    COPD Half-Brother    Dementia Half-Sister    Kidney cancer Neg Hx    Bladder Cancer Neg Hx     Social History Social History   Tobacco Use   Smoking status: Never   Smokeless tobacco: Never  Vaping Use   Vaping Use: Never used  Substance Use Topics   Alcohol use: No    Alcohol/week: 0.0 standard drinks of alcohol   Drug use: No     Allergies   Azo [phenazopyridine], Colesevelam, Colesevelam hcl, Doxycycline hyclate, Ezetimibe, Fluoxetine, Lansoprazole, Other, Statins, Ace inhibitors, Doxycycline, Estradiol, Nsaids, and Risedronate sodium   Review of Systems Review of Systems: negative unless otherwise stated in HPI.      Physical Exam Triage Vital Signs ED Triage Vitals  Enc Vitals Group     BP 03/04/23 0831 (!) 157/77     Pulse Rate 03/04/23 0831 95     Resp --      Temp 03/04/23 0831 98.5 F (36.9 C)     Temp Source 03/04/23 0831 Oral     SpO2 03/04/23 0831 96 %     Weight --      Height --      Head Circumference --      Peak Flow --      Pain Score 03/04/23 0830 10     Pain Loc --      Pain Edu? --      Excl. in GC? --    No data found.  Updated Vital Signs BP (!) 157/77 (BP Location: Right Arm)   Pulse 95   Temp 98.5 F (36.9 C) (Oral)   SpO2 96%   Visual Acuity Right Eye Distance:   Left Eye Distance:   Bilateral Distance:    Right Eye Near:   Left Eye Near:     Bilateral Near:     Physical Exam GEN:     alert, elderly female in no distress    HENT:  mucus membranes moist,  no nasal discharge EYES:   no scleral injection or discharge, no scleral icterus NECK:  normal ROM RESP:  no increased work of breathing, clear to auscultation bilaterally CVS:   regular rate and rhythm CHEST WALL: No overlying skin changes, no deformity ABD:    Soft, non-tender, nondistended, active bowel sounds throughout, no palpable masses, no hepatomegaly Skin:   warm and  dry, no rash on visible skin    UC Treatments / Results  Labs (all labs ordered are listed, but only abnormal results are displayed) Labs Reviewed - No data to display  EKG   Radiology DG Chest 2 View  Result Date: 03/04/2023 CLINICAL DATA:  Lower chest, mid back, RIGHT flank and RIGHT rib pain for months EXAM: CHEST - 2 VIEW COMPARISON:  Chest radiographs 11/09/2018 FINDINGS: Normal heart size, mediastinal contours, and pulmonary vascularity. Atherosclerotic calcification aorta. Emphysematous changes without infiltrate, pleural effusion, or pneumothorax. Bones demineralized. No rib fracture or bone destruction. 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 Aortic Atherosclerosis (ICD10-I70.0) and Emphysema (ICD10-J43.9). Electronically Signed   By: Ulyses Southward M.D.   On: 03/04/2023 10:01   DG Ribs Bilateral  Result Date: 03/04/2023 CLINICAL DATA:  Pain mid back, RIGHT flank and under RIGHT ribs for months EXAM: BILATERAL RIBS - 3+ VIEW COMPARISON:  Chest radiographs 03/04/2023 FINDINGS: Osseous demineralization. No rib fracture or bone destruction. IMPRESSION: No acute rib abnormalities. Electronically Signed   By: Ulyses Southward M.D.   On: 03/04/2023 10:00    Procedures Procedures (including critical care time)  Medications Ordered in UC Medications - No data to display  Initial Impression / Assessment and Plan / UC Course  I have reviewed the triage vital signs and the nursing  notes.  Pertinent labs & imaging results that were available during my care of the patient were reviewed by me and considered in my medical decision making (see chart for details).       Pt is a 82 y.o. female who presents for chronic diarrhea and chronic rib pain.  Her diarrhea is unchanged. Her rib pain has been worse in the past 3 weeks.  Noelani is afebrile here without recent antipyretics. Satting well on room air. Overall pt is well appearing, well hydrated, without respiratory distress. Exam is unremarkable.  Obtained bilateral rib with chest xrays.   Chest xray personally reviewed by me without focal pneumonia, pleural effusion, cardiomegaly or pneumothorax. Radiologist reports emphysema but pt denies history of smoking or tobacco use.  Chart review, she has had CT chest previously that showed multiple pulmonary nodules.  Recommended patient follow-up with her pulmonologist about this.  Discussed trial of muscle relaxer and patient is agreeable.  Risks of this medication discussed with her and her daughter but patient would like to proceed. Tizanidine 4 mg sent to patient pharmacy.  Continue Tylenol as needed.  Regarding her diarrhea, pt reports normal blood work at her PCP's office a few weeks ago. She has history of esophageal stricture was followed by gastroenterology.  Reviewed gastroenterology notes.  She states she has not had a recent colonoscopy and will not get one because she took too long to recover from the last one.  I do not see where she seen GI outpatient for her chronic diarrhea.  Recommended that she schedule an appointment with gastroenterology and speak with her PCP about a referral.  I suspect this may be functional versus IBS-D.    Return and ED precautions given and voiced understanding. Discussed MDM, treatment plan and plan for follow-up with patient and her daughter who agree with plan.     Final Clinical Impressions(s) / UC Diagnoses   Final diagnoses:  Rib  pain  Right flank pain  Chronic diarrhea     Discharge Instructions      Your xrays did not show cause of you pain.  There were no broken bones, dislocated bones, pneumonia or  fluid buildup in your lungs.  Unfortunately, I did not find the cause of your rib pain here today.  You could have nerve related pain in the area.  Follow-up with your orthopedic provider for additional evaluation.  Guarding or diarrhea: Keep a food log to see how often this is happening and what foods are related.  You may need to see a gastroenterologist again.  Be sure to follow-up with your primary care provider.  Take Tylenol [phone] mg 2-3 times a day for pain.      ED Prescriptions     Medication Sig Dispense Auth. Provider   tiZANidine (ZANAFLEX) 4 MG tablet Take 1 tablet (4 mg total) by mouth every 6 (six) hours as needed for muscle spasms. 30 tablet Katha Cabal, DO      PDMP not reviewed this encounter.   Katha Cabal, DO 03/04/23 1028

## 2023-03-04 NOTE — ED Triage Notes (Signed)
Pt c/o mid back pain, RT sided flank pain, and pain under RT ribs x months pt states pain has gotten worse. Pt states she has talked to her PCP about this and was sent to orthopedics.  Pt also c/o diarrhea that has gotten worse over time, pt states she does take imodium when her diarrhea gets severe.

## 2023-03-04 NOTE — Discharge Instructions (Addendum)
Your xrays did not show cause of you pain.  There were no broken bones, dislocated bones, pneumonia or fluid buildup in your lungs.  Unfortunately, I did not find the cause of your rib pain here today.  You could have nerve related pain in the area.  Follow-up with your orthopedic provider for additional evaluation.  Guarding or diarrhea: Keep a food log to see how often this is happening and what foods are related.  You may need to see a gastroenterologist again.  Be sure to follow-up with your primary care provider.  Take Tylenol [phone] mg 2-3 times a day for pain.

## 2023-03-19 IMAGING — CR DG LUMBAR SPINE COMPLETE 4+V
5 series · 5 of 5 positions shown · non-contrast
Comparison: MRI 08/24/2017

CLINICAL DATA: Right low back pain, hip pain

EXAM:
LUMBAR SPINE - COMPLETE 4+ VIEW

[l-spine ap]
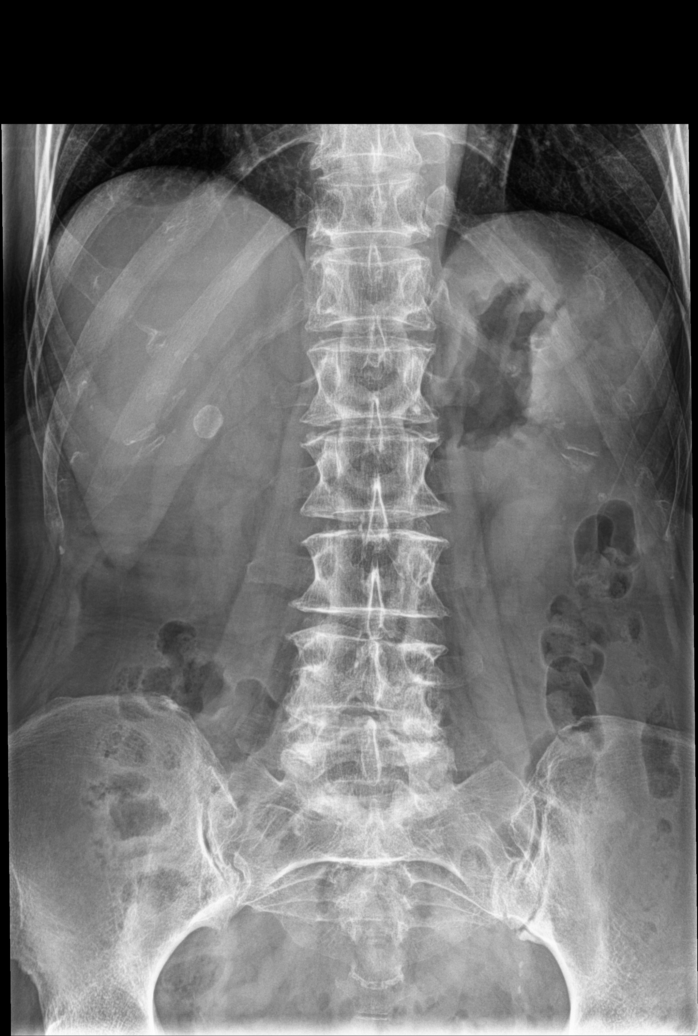

[l-spine obl (1 of 2)]
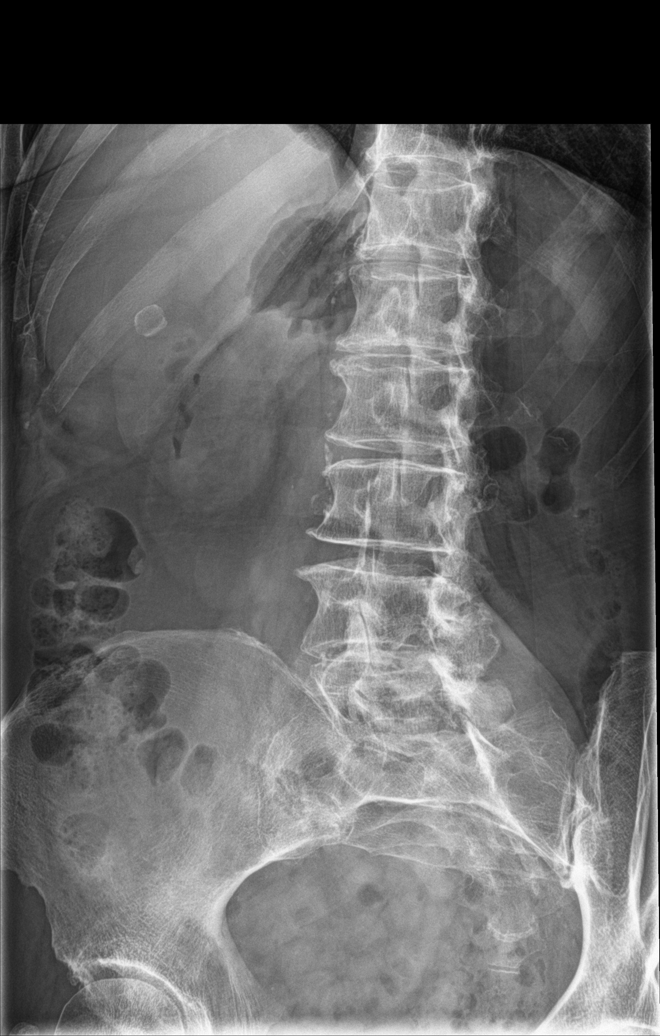

[l-spine obl (2 of 2)]
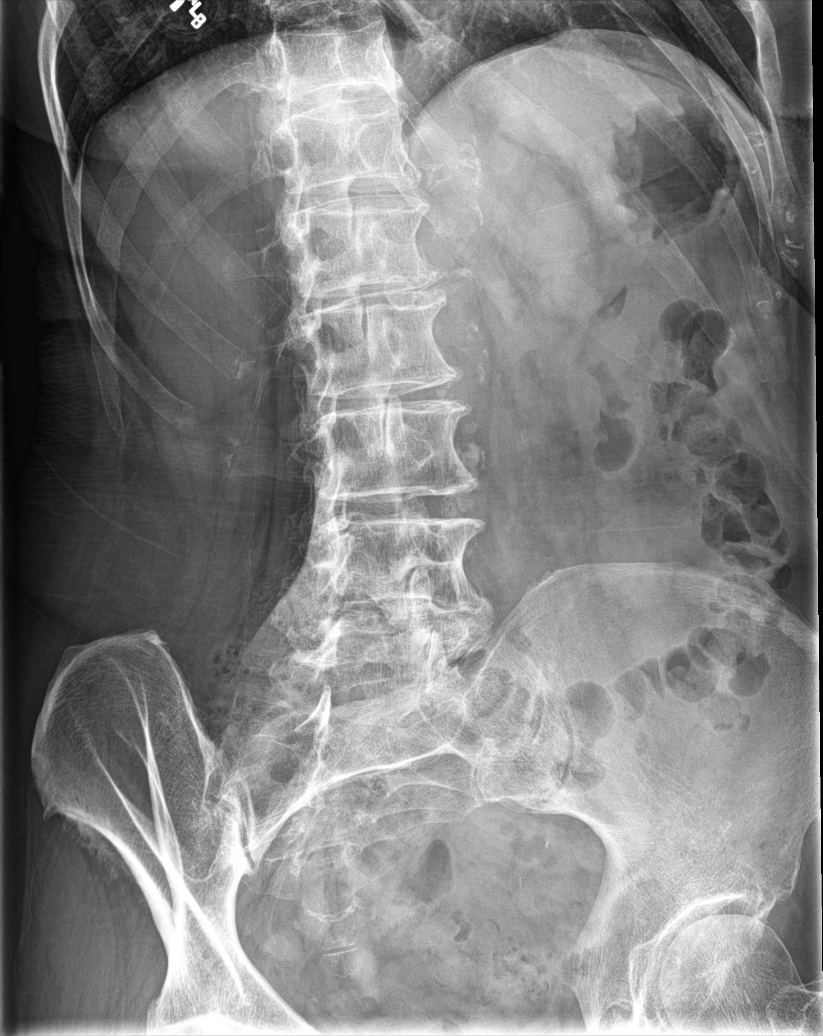

[l-spine lat]
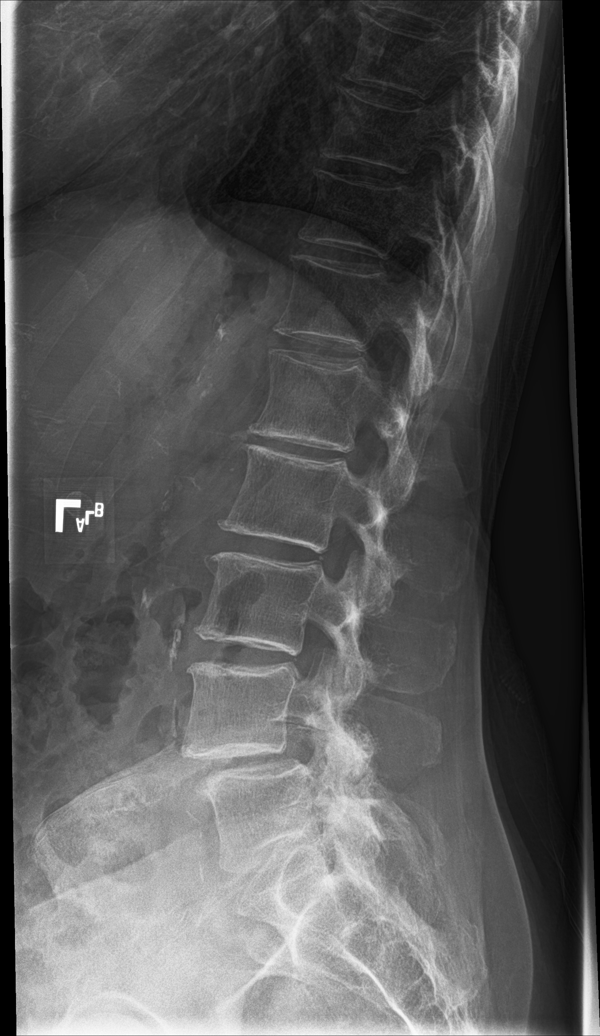

[l-spine spot]
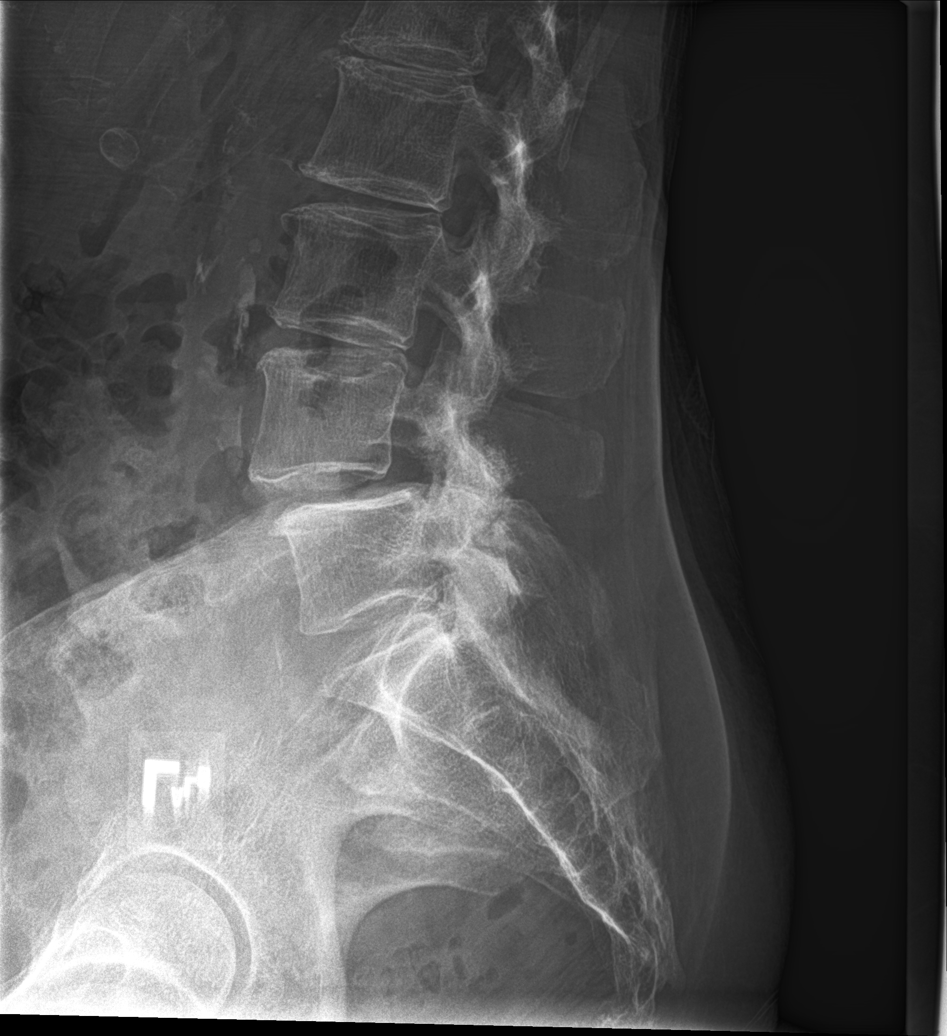

[5 of 5 positions shown; findings below may reference images not displayed]

FINDINGS: There has developed grade 1 anterolisthesis of L4 upon L5
approximately 5 mm since the prior examination. Normal lumbar
lordosis. No acute fracture of the lumbar spine. Vertebral body
height has been preserved. There is progressive intervertebral disc
space narrowing and endplate remodeling throughout the lumbar spine,
most severe at L4-5 and L1-2 in keeping with changes of mild to
moderate degenerative disc disease. Oblique views demonstrate mild
to moderate facet arthrosis bilaterally at L4-5. The paraspinal soft
tissues are unremarkable. Cholelithiasis noted.
IMPRESSION: Progressive degenerative disc disease throughout the lumbar spine,
most severe at L4-5, with interval development of grade 1
anterolisthesis of L4-5.

## 2023-04-13 ENCOUNTER — Ambulatory Visit: Payer: Medicare Other | Admitting: Physician Assistant

## 2023-07-02 ENCOUNTER — Other Ambulatory Visit: Payer: Self-pay

## 2023-07-06 ENCOUNTER — Ambulatory Visit (INDEPENDENT_AMBULATORY_CARE_PROVIDER_SITE_OTHER): Payer: Medicare Other | Admitting: Gastroenterology

## 2023-07-06 ENCOUNTER — Encounter: Payer: Self-pay | Admitting: Gastroenterology

## 2023-07-06 VITALS — BP 132/77 | HR 70 | Temp 97.7°F | Ht 65.51 in | Wt 132.0 lb

## 2023-07-06 DIAGNOSIS — R142 Eructation: Secondary | ICD-10-CM

## 2023-07-06 DIAGNOSIS — R197 Diarrhea, unspecified: Secondary | ICD-10-CM | POA: Diagnosis not present

## 2023-07-06 DIAGNOSIS — K529 Noninfective gastroenteritis and colitis, unspecified: Secondary | ICD-10-CM

## 2023-07-06 DIAGNOSIS — R1319 Other dysphagia: Secondary | ICD-10-CM | POA: Diagnosis not present

## 2023-07-06 NOTE — Progress Notes (Signed)
[name], MD [address]  [address]  [place], [place] [address]  Main: [phone]  Fax: [phone]    Gastroenterology Consultation  Referring Provider:     Center, [facility]* Primary Care Physician:  Shane Crutch, [place] Primary Gastroenterologist:  Dr. [name] Reason for Consultation: Frequent burping, intermittent diarrhea, belching, dysphagia        HPI:   Sara Jackson is a 82 y.o. female referred by Shane Crutch, PA  for consultation & management of chronic symptoms of frequent belching, difficulty swallowing to solids.  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  Patient has been taking pantoprazole 20 mg daily.  She has been on lactose-free diet for several years.  She is also concerned about sporadic episodes of postprandial diarrhea about 3-4 episodes which happens about once a week at least.  She denies any nocturnal diarrhea, rectal bleeding, weight loss.  She has not figured out any particular relation to food.  She denies any weight loss. Reports undergoing colonoscopy several years ago.  She reports that her bowel movements were erratic for at least 1 year and not keen on undergoing another colonoscopy  NSAIDs: None  Antiplts/Anticoagulants/Anti thrombotics: None  GI Procedures:  Upper endoscopy 03/12/2016 by Dr. Servando Snare - Small hiatal hernia. - Benign- appearing esophageal stenosis. Dilated to 18 mm. - Normal stomach. - Normal examined duodenum. - No specimens collected.  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   Vaginal atrophy     Past Surgical History:  Procedure Laterality Date   BREAST BIOPSY Left    CATARACT EXTRACTION W/PHACO Right 08/03/2022   Procedure: CATARACT EXTRACTION PHACO AND INTRAOCULAR LENS PLACEMENT (IOC) RIGHT CLARION VIVITY TORIC  LENS  4.49 00:31.4;  Surgeon: Nevada Crane, MD;  Location: [facility];  Service: Ophthalmology;  Laterality: Right;   CATARACT EXTRACTION W/PHACO Left 08/17/2022   Procedure: CATARACT EXTRACTION PHACO AND INTRAOCULAR LENS PLACEMENT (IOC) LEFT CLARION VIVITY TORIC LENS HYDRUS MICROSTENT;  Surgeon: Nevada Crane, MD;  Location: [facility];  Service: Ophthalmology;  Laterality: Left;  4.49 0:37.0   ESOPHAGOGASTRODUODENOSCOPY (EGD) WITH PROPOFOL N/A 03/12/2016   Procedure: ESOPHAGOGASTRODUODENOSCOPY (EGD) WITH PROPOFOL with dilation;  Surgeon: Midge Minium, MD;  Location: [facility];  Service: Endoscopy;  Laterality: N/A;   MELANOMA EXCISION     RADICAL HYSTERECTOMY       Current Outpatient Medications:    acetaminophen (TYLENOL) 325 MG tablet, Take by mouth., Disp: , Rfl:    Cholecalciferol (VITAMIN D3) 50 MCG (2000 UT) capsule, Take 2,000 Units by mouth daily., Disp: , Rfl:    estradiol (ESTRACE) 1 MG tablet, Take 0.5 mg by mouth. , Disp: , Rfl:    latanoprost (XALATAN) 0.005 % ophthalmic solution, 1 drop at bedtime., Disp: , Rfl:    lisinopril-hydrochlorothiazide (ZESTORETIC) 20-12.5 MG tablet, Take 1 tablet by mouth daily., Disp: ,  Rfl:    Multiple Vitamins-Minerals (CENTRUM SILVER PO), Take by mouth daily., Disp: , Rfl:    pantoprazole (PROTONIX) 40 MG tablet, Take 20 mg by mouth daily., Disp: , Rfl:    calcium carbonate (TUMS EX) 750 MG chewable tablet, Chew 1 tablet by mouth 2 (two) times daily. (Patient not taking: Reported on 07/06/2023), Disp: , Rfl:     cetirizine (ZYRTEC) 10 MG tablet, Take 1 tablet by mouth daily. (Patient not taking: Reported on 07/06/2023), Disp: , Rfl:    Cholecalciferol (VITAMIN D PO), Take by mouth. (Patient not taking: Reported on 07/06/2023), Disp: , Rfl:    conjugated estrogens (PREMARIN) vaginal cream, Place 1 applicator vaginally daily. (Patient not taking: Reported on 07/06/2023), Disp: , Rfl:    estradiol (ESTRACE) 0.1 MG/GM vaginal cream, Place 1 Applicatorful vaginally. (Patient not taking: Reported on 07/06/2023), Disp: , Rfl:    fluticasone (FLONASE) 50 MCG/ACT nasal spray, Place 2 sprays into both nostrils daily. (Patient not taking: Reported on 07/06/2023), Disp: , Rfl:    tiZANidine (ZANAFLEX) 4 MG tablet, Take 1 tablet (4 mg total) by mouth every 6 (six) hours as needed for muscle spasms. (Patient not taking: Reported on 07/06/2023), Disp: 30 tablet, Rfl: 0   traMADol (ULTRAM) 50 MG tablet, Take 50 mg by mouth every 6 (six) hours as needed for moderate pain. (Patient not taking: Reported on 07/06/2023), Disp: , Rfl:    Family History  Problem Relation Age of Onset   Cancer Mother    Cerebral aneurysm Father    COPD Half-Brother    Dementia Half-Sister    Kidney cancer Neg Hx    Bladder Cancer Neg Hx      Social History   Tobacco Use   Smoking status: Never   Smokeless tobacco: Never  Vaping Use   Vaping status: Never Used  Substance Use Topics   Alcohol use: No    Alcohol/week: 0.0 standard drinks of alcohol   Drug use: No    Allergies as of 07/06/2023 - Review Complete 07/06/2023  Allergen Reaction Noted   Azo [phenazopyridine] Itching 06/20/2015   Colesevelam Diarrhea    Colesevelam hcl Diarrhea 07/04/2015   Doxycycline hyclate Other (See Comments)    Ezetimibe Diarrhea 07/04/2015   Fluoxetine Other (See Comments) 10/30/2015   Lansoprazole Diarrhea 07/04/2015   Other Other (See Comments) 02/24/2022   Statins Diarrhea 06/20/2015   Ace inhibitors Rash 07/04/2015   Doxycycline Rash 07/04/2015    Estradiol Rash and Other (See Comments) 07/04/2015   Nsaids Rash 07/04/2015   Risedronate sodium Rash 07/04/2015    Review of Systems:    All systems reviewed and negative except where noted in HPI.   Physical Exam:  BP 132/77 (BP Location: Left Arm, Patient Position: Sitting, Cuff Size: Normal)   Pulse 70   Temp 97.7 F (36.5 C) (Oral)   Ht 5' 5.51" (1.664 m)   Wt 132 lb (59.9 kg)   BMI 21.63 kg/m  No LMP recorded. Patient has had a hysterectomy.  General:   Alert,  Well-developed, well-nourished, pleasant and cooperative in NAD Head:  Normocephalic and atraumatic. Eyes:  Sclera clear, no icterus.   Conjunctiva pink. Ears:  Normal auditory acuity. Nose:  No deformity, discharge, or lesions. Mouth:  No deformity or lesions,oropharynx pink & moist. Neck:  Supple; no masses or thyromegaly. Lungs:  Respirations even and unlabored.  Clear throughout to auscultation.   No wheezes, crackles, or rhonchi. No acute distress. Heart:  Regular rate and rhythm; no murmurs,  clicks, rubs, or gallops. Abdomen:  Normal bowel sounds. Soft, non-tender and mildly distended, tympanic to percussion without masses, hepatosplenomegaly or hernias noted.  No guarding or rebound tenderness.   Rectal: Not performed Msk:  Symmetrical without gross deformities. Good, equal movement & strength bilaterally. Pulses:  Normal pulses noted. Extremities:  No clubbing or edema.  No cyanosis. Neurologic:  Alert and oriented x3;  grossly normal neurologically. Skin:  Intact without significant lesions or rashes. No jaundice. Psych:  Alert and cooperative. Normal mood and affect.  Imaging Studies: Reviewed  Assessment and Plan:   Sara Jackson is a 82 y.o. Caucasian female with history of esophageal dysphagia, status post dilation of Schatzki's ring in the past, is seen in consultation for chronic symptoms of dysphagia to solids, belching, intermittent postprandial nonbloody diarrhea without any other  constitutional signs or symptoms No known history of anemia, TSH normal  Discussed with patient regarding upper endoscopy for dysphagia and colonoscopy to evaluate for microscopic colitis.  She is agreeable to undergo upper endoscopy but not colonoscopy Check GI profile PCR to rule out any infection, pancreatic fecal elastase levels, fecal calprotectin levels EGD with gastric and duodenal biopsies and possible esophageal dilation   Follow up in 2 months with PA-C, Lavonna Monarch, MD

## 2023-07-12 ENCOUNTER — Other Ambulatory Visit: Payer: Self-pay | Admitting: Gastroenterology

## 2023-07-14 LAB — GI PROFILE, STOOL, PCR

## 2023-07-14 LAB — PANCREATIC ELASTASE, FECAL: Pancreatic Elastase, Fecal: >800 ug Elast./g (ref >200)

## 2023-07-14 LAB — CALPROTECTIN, FECAL: Calprotectin, Fecal: 54 ug/g (ref 0–120)

## 2023-07-15 ENCOUNTER — Telehealth: Payer: Self-pay | Admitting: Gastroenterology

## 2023-07-15 NOTE — Telephone Encounter (Signed)
Pt requesting call back to get lab results

## 2023-07-15 NOTE — Telephone Encounter (Signed)
Please advise of results

## 2023-07-16 ENCOUNTER — Telehealth: Payer: Self-pay

## 2023-07-16 ENCOUNTER — Other Ambulatory Visit: Payer: Self-pay

## 2023-07-16 DIAGNOSIS — R1319 Other dysphagia: Secondary | ICD-10-CM

## 2023-07-16 NOTE — Telephone Encounter (Signed)
Patient wanted to explain the Flexsigmoid procedure. So I explained the procedure and and told her she would still be able to take biopsy to make sure she did not have microscopic colitis. Explained the prep for the procedure. She states she is going to just stick with the EGD and will let us know if symptoms get worse and she changes her mind

## 2023-07-16 NOTE — Telephone Encounter (Signed)
Patient called back and she only wants to have the EGD done. Schedule the EGD for 07/28/2023. Went over instructions and mailed them.

## 2023-07-16 NOTE — Telephone Encounter (Signed)
Called patient and patient daughter Efraim Kaufmann answered and states she is in the shower and the DPR that is in the chart is not check for all [place] so can give any information to her. She states she will have her give [place] a call back

## 2023-07-16 NOTE — Telephone Encounter (Signed)
Called patient and patient verbalized understanding of results. She states that her symptoms are improving at this time and her stools are getting more normal. She states she has been going to a chiropractor and does not know what he is doing but symptoms has improved. She states she does not want any procedures at this time but will let us know if anything changes. Phone did disconnect at the end of the call and called patient back and left a message stating everything she said and if this was not correct or if she changes her mind to let us know and give [place] a call back.

## 2023-07-16 NOTE — Telephone Encounter (Signed)
-----   Message from [facility] sent at 07/15/2023  3:50 PM EDT ----- Please inform patient that her stool studies all came back normal.  There is no evidence of infection or inflammation.  We can offer flexible sigmoidoscopy in addition to upper endoscopy to evaluate for microscopic colitis if patient is still reluctant to undergo colonoscopy  RV

## 2023-07-16 NOTE — Telephone Encounter (Signed)
Pt left a vm wanting you to call her back regarding the EGD you schedule this morning. She apologized for calling back but she said she is confused about the procedure and would like you to explain why this is needed.

## 2023-07-28 ENCOUNTER — Encounter
Admission: RE | Disposition: A | Discharge status: Home / Self Care | Payer: Self-pay | Source: Home / Self Care | Attending: Gastroenterology

## 2023-07-28 ENCOUNTER — Ambulatory Visit
Admission: RE | Admit: 2023-07-28 | Discharge: 2023-07-28 | Disposition: A | Discharge status: Home / Self Care | Payer: Medicare Other | Attending: Gastroenterology | Admitting: Gastroenterology

## 2023-07-28 ENCOUNTER — Ambulatory Visit: Payer: Medicare Other | Admitting: Certified Registered"

## 2023-07-28 DIAGNOSIS — K3189 Other diseases of stomach and duodenum: Secondary | ICD-10-CM | POA: Insufficient documentation

## 2023-07-28 DIAGNOSIS — K222 Esophageal obstruction: Secondary | ICD-10-CM | POA: Diagnosis not present

## 2023-07-28 DIAGNOSIS — Z85820 Personal history of malignant melanoma of skin: Secondary | ICD-10-CM | POA: Insufficient documentation

## 2023-07-28 DIAGNOSIS — I10 Essential (primary) hypertension: Secondary | ICD-10-CM | POA: Insufficient documentation

## 2023-07-28 DIAGNOSIS — R1319 Other dysphagia: Secondary | ICD-10-CM

## 2023-07-28 DIAGNOSIS — K2940 Chronic atrophic gastritis without bleeding: Secondary | ICD-10-CM | POA: Diagnosis not present

## 2023-07-28 DIAGNOSIS — K219 Gastro-esophageal reflux disease without esophagitis: Secondary | ICD-10-CM | POA: Insufficient documentation

## 2023-07-28 DIAGNOSIS — R1310 Dysphagia, unspecified: Secondary | ICD-10-CM

## 2023-07-28 DIAGNOSIS — K449 Diaphragmatic hernia without obstruction or gangrene: Secondary | ICD-10-CM | POA: Insufficient documentation

## 2023-07-28 DIAGNOSIS — R1314 Dysphagia, pharyngoesophageal phase: Secondary | ICD-10-CM | POA: Diagnosis present

## 2023-07-28 HISTORY — PX: ESOPHAGOGASTRODUODENOSCOPY (EGD) WITH PROPOFOL: SHX5813

## 2023-07-28 HISTORY — PX: BIOPSY: SHX5522

## 2023-07-28 SURGERY — ESOPHAGOGASTRODUODENOSCOPY (EGD) WITH PROPOFOL
Anesthesia: General

## 2023-07-28 MED ORDER — PROPOFOL 10 MG/ML IV BOLUS
INTRAVENOUS | Status: DC | PRN
Start: 2023-07-28 — End: 2023-07-28
  Administered 2023-07-28 (×4): 10 mg via INTRAVENOUS
  Administered 2023-07-28: 90 mg via INTRAVENOUS

## 2023-07-28 MED ORDER — PROPOFOL 10 MG/ML IV BOLUS
INTRAVENOUS | Status: AC
  Filled 2023-07-28: qty 20

## 2023-07-28 MED ORDER — LIDOCAINE HCL (CARDIAC) PF 100 MG/5ML IV SOSY
PREFILLED_SYRINGE | INTRAVENOUS | Status: DC | PRN
Start: 2023-07-28 — End: 2023-07-28
  Administered 2023-07-28: 100 mg via INTRAVENOUS

## 2023-07-28 MED ORDER — LIDOCAINE HCL (PF) 2 % IJ SOLN
INTRAMUSCULAR | Status: AC
  Filled 2023-07-28: qty 5

## 2023-07-28 MED ORDER — SODIUM CHLORIDE 0.9 % IV SOLN: INTRAVENOUS | Status: DC

## 2023-07-28 MED ORDER — OMEPRAZOLE 40 MG PO CPDR: 40.0000 mg | DELAYED_RELEASE_CAPSULE | Freq: Every day | ORAL | 0 refills | Status: DC

## 2023-07-28 NOTE — Anesthesia Preprocedure Evaluation (Signed)
Anesthesia Evaluation  Patient identified by MRN, date of birth, ID band Patient awake    Reviewed: Allergy & Precautions, NPO status , Patient's Chart, lab work & pertinent test results  History of Anesthesia Complications Negative for: history of anesthetic complications  Airway Mallampati: III  TM Distance: <3 FB Neck ROM: full    Dental  (+) Chipped   Pulmonary neg pulmonary ROS, neg shortness of breath   Pulmonary exam normal        Cardiovascular Exercise Tolerance: Good hypertension, Normal cardiovascular exam     Neuro/Psych  Neuromuscular disease  negative psych ROS   GI/Hepatic Neg liver ROS, hiatal hernia,GERD  Controlled,,  Endo/Other  negative endocrine ROS    Renal/GU negative Renal ROS  negative genitourinary   Musculoskeletal   Abdominal   Peds  Hematology negative hematology ROS (+)   Anesthesia Other Findings Patient reports that they do not think that any food or pills are stuck in their throat at this time.  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) No date: Vaginal atrophy  Past Surgical History: No date: BREAST BIOPSY; Left 08/03/2022: CATARACT EXTRACTION W/PHACO; Right     Comment:  Procedure: CATARACT EXTRACTION PHACO AND INTRAOCULAR               LENS PLACEMENT (IOC) RIGHT CLARION VIVITY TORIC  LENS                4.49 00:31.4;  Surgeon: Nevada Crane, MD;                Location: [facility];  Service: Ophthalmology;                Laterality: Right; 08/17/2022: CATARACT EXTRACTION W/PHACO; Left     Comment:  Procedure: CATARACT EXTRACTION PHACO AND INTRAOCULAR               LENS PLACEMENT (IOC) LEFT CLARION VIVITY TORIC LENS               HYDRUS MICROSTENT;  Surgeon: Nevada Crane, MD;                Location: [facility];  Service: Ophthalmology;                Laterality: Left;  4.49 0:37.0 03/12/2016: ESOPHAGOGASTRODUODENOSCOPY (EGD) WITH PROPOFOL; N/A     Comment:  Procedure: ESOPHAGOGASTRODUODENOSCOPY (EGD) WITH               PROPOFOL with dilation;  Surgeon: Midge Minium, MD;                Location: [facility];  Service: Endoscopy;                Laterality: N/A; No date: MELANOMA EXCISION No date: RADICAL HYSTERECTOMY  BMI    Body Mass Index: 21.97 kg/m      Reproductive/Obstetrics negative OB ROS                             Anesthesia Physical  Anesthesia Plan  ASA: 2  Anesthesia Plan: General   Post-op Pain Management:    Induction: Intravenous  PONV Risk Score and Plan: Propofol infusion and TIVA  Airway Management Planned: Natural Airway and Nasal Cannula  Additional Equipment:   Intra-op Plan:   Post-operative Plan:   Informed Consent: I have reviewed the patients History and Physical, chart, labs and discussed the procedure including the risks, benefits and alternatives for the proposed anesthesia with the patient or authorized representative who has indicated his/her understanding and acceptance.     Dental Advisory Given  Plan Discussed with: Anesthesiologist, CRNA and Surgeon  Anesthesia Plan Comments: (Patient consented for risks of anesthesia including but not limited to:  - adverse reactions to medications - risk of airway placement if required - damage to eyes, teeth, lips or other oral mucosa - nerve damage due to  positioning  - sore throat or hoarseness - Damage to heart, brain, nerves, lungs, other parts of body or loss of life  Patient voiced understanding.)       Anesthesia Quick Evaluation

## 2023-07-28 NOTE — Transfer of Care (Signed)
Immediate Anesthesia Transfer of Care Note  Patient: Sara Jackson  Procedure(s) Performed: ESOPHAGOGASTRODUODENOSCOPY (EGD) WITH PROPOFOL BIOPSY  Patient Location: Endoscopy Unit  Anesthesia Type:General  Level of Consciousness: awake, alert , and oriented  Airway & Oxygen Therapy: Patient Spontanous Breathing  Post-op Assessment: Report given to RN and Post -op Vital signs reviewed and stable  Post vital signs: Reviewed and stable  Last Vitals:  Vitals Value Taken Time  BP 114/86 07/28/23 1033  Temp 35.8 1032  Pulse 88 07/28/23 1032  Resp 25 07/28/23 1032  SpO2 98 % 07/28/23 1032  Vitals shown include unfiled device data.  Last Pain:  Vitals:   07/28/23 0942  TempSrc: Temporal  PainSc: 0-No pain         Complications: No notable events documented.

## 2023-07-28 NOTE — Anesthesia Postprocedure Evaluation (Signed)
Anesthesia Post Note  Patient: Sara Jackson  Procedure(s) Performed: ESOPHAGOGASTRODUODENOSCOPY (EGD) WITH PROPOFOL BIOPSY  Patient location during evaluation: Endoscopy Anesthesia Type: General Level of consciousness: awake and alert Pain management: pain level controlled Vital Signs Assessment: post-procedure vital signs reviewed and stable Respiratory status: spontaneous breathing, nonlabored ventilation, respiratory function stable and patient connected to nasal cannula oxygen Cardiovascular status: blood pressure returned to baseline and stable Postop Assessment: no apparent nausea or vomiting Anesthetic complications: no   No notable events documented.   Last Vitals:  Vitals:   07/28/23 0942 07/28/23 1032  BP: (!) 193/89 128/78  Pulse: (!) 106 82  Resp: 18 20  Temp: (!) 35.8 C 36.4 C  SpO2: 99% 98%    Last Pain:  Vitals:   07/28/23 1052  TempSrc:   PainSc: 0-No pain                 Cleda Mccreedy Leyda Vanderwerf

## 2023-07-28 NOTE — H&P (Signed)
[name], MD [address]  [address]  [place], [place] [address]  Main: [phone]  Fax: [phone] Pager: [phone]  Primary Care Physician:  Shane Crutch, [place] Primary Gastroenterologist:  Dr. [name]  Pre-Procedure History & Physical: HPI:  Sara Jackson is a 82 y.o. female is here for an endoscopy.   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   Vaginal atrophy     Past Surgical History:  Procedure Laterality Date   BREAST BIOPSY Left    CATARACT EXTRACTION W/PHACO Right 08/03/2022   Procedure: CATARACT EXTRACTION PHACO AND INTRAOCULAR LENS PLACEMENT (IOC) RIGHT CLARION VIVITY TORIC  LENS  4.49 00:31.4;  Surgeon: Nevada Crane, MD;  Location: [facility];  Service: Ophthalmology;  Laterality: Right;   CATARACT EXTRACTION W/PHACO Left 08/17/2022   Procedure: CATARACT EXTRACTION PHACO AND INTRAOCULAR LENS PLACEMENT (IOC) LEFT CLARION VIVITY TORIC LENS HYDRUS MICROSTENT;  Surgeon: Nevada Crane, MD;  Location: [facility];  Service: Ophthalmology;  Laterality: Left;  4.49 0:37.0   ESOPHAGOGASTRODUODENOSCOPY (EGD) WITH PROPOFOL N/A 03/12/2016   Procedure: ESOPHAGOGASTRODUODENOSCOPY (EGD) WITH PROPOFOL with dilation;  Surgeon: Midge Minium, MD;  Location: [facility];  Service: Endoscopy;  Laterality: N/A;   MELANOMA EXCISION     RADICAL HYSTERECTOMY      Prior to Admission medications   Medication Sig  Start Date End Date Taking? Authorizing Provider  acetaminophen (TYLENOL) 325 MG tablet Take by mouth. 07/11/14   [provider]  calcium carbonate (TUMS EX) 750 MG chewable tablet Chew 1 tablet by mouth 2 (two) times daily. Patient not taking: Reported on 07/06/2023    [provider]  cetirizine (ZYRTEC) 10 MG tablet Take 1 tablet by mouth daily. Patient not taking: Reported on 07/06/2023 08/02/17   [provider]  Cholecalciferol (VITAMIN D PO) Take by mouth. Patient not taking: Reported on 07/06/2023    [provider]  Cholecalciferol (VITAMIN D3) 50 MCG (2000 UT) capsule Take 2,000 Units by mouth daily.    [provider]  conjugated estrogens (PREMARIN) vaginal cream Place 1 applicator vaginally daily. Patient not taking: Reported on 07/06/2023 07/22/17   [provider]  estradiol (ESTRACE) 0.1 MG/GM vaginal cream Place 1 Applicatorful vaginally. Patient not taking: Reported on 07/06/2023 01/05/23   [provider]  estradiol (ESTRACE) 1 MG tablet Take 0.5 mg by mouth.  02/24/16   [provider]  fluticasone (FLONASE) 50 MCG/ACT nasal spray Place 2 sprays into both nostrils daily. Patient not taking: Reported on 07/06/2023 12/08/17   [provider]  latanoprost (XALATAN) 0.005 % ophthalmic solution 1 drop at bedtime.    [provider]  lisinopril-hydrochlorothiazide (ZESTORETIC) 20-12.5 MG tablet Take 1 tablet by mouth daily.    [provider]  Multiple Vitamins-Minerals (CENTRUM SILVER PO) Take by mouth daily.    [provider]  pantoprazole (PROTONIX) 40 MG tablet Take 20 mg by mouth daily.  [provider]  tiZANidine (ZANAFLEX) 4 MG tablet Take 1 tablet (4 mg total) by mouth every 6 (six) hours as needed for muscle spasms. Patient not taking: Reported on 07/06/2023 03/04/23   Katha Cabal, DO  traMADol (ULTRAM) 50 MG tablet Take 50 mg by mouth every 6 (six) hours as needed for  moderate pain. Patient not taking: Reported on 07/06/2023 11/10/17   [provider]    Allergies as of 07/16/2023 - Review Complete 07/06/2023  Allergen Reaction Noted   Azo [phenazopyridine] Itching 06/20/2015   Colesevelam Diarrhea    Colesevelam hcl Diarrhea 07/04/2015   Doxycycline hyclate Other (See Comments)    Ezetimibe Diarrhea 07/04/2015   Fluoxetine Other (See Comments) 10/30/2015   Lansoprazole Diarrhea 07/04/2015   Other Other (See Comments) 02/24/2022   Statins Diarrhea 06/20/2015   Ace inhibitors Rash 07/04/2015   Doxycycline Rash 07/04/2015   Estradiol Rash and Other (See Comments) 07/04/2015   Nsaids Rash 07/04/2015   Risedronate sodium Rash 07/04/2015    Family History  Problem Relation Age of Onset   Cancer Mother    Cerebral aneurysm Father    COPD Half-Brother    Dementia Half-Sister    Kidney cancer Neg Hx    Bladder Cancer Neg Hx     Social History   Socioeconomic History   Marital status: Widowed    Spouse name: Not on file   Number of children: Not on file   Years of education: Not on file   Highest education level: Not on file  Occupational History   Not on file  Tobacco Use   Smoking status: Never   Smokeless tobacco: Never  Vaping Use   Vaping status: Never Used  Substance and Sexual Activity   Alcohol use: No    Alcohol/week: 0.0 standard drinks of alcohol   Drug use: No   Sexual activity: Not on file  Other Topics Concern   Not on file  Social History Narrative   Not on file   Social Determinants of Health   Financial Resource Strain: Not on file  Food Insecurity: Not on file  Transportation Needs: Not on file  Physical Activity: Not on file  Stress: Not on file  Social Connections: Not on file  Intimate Partner Violence: Not on file    Review of Systems: See HPI, otherwise negative ROS  Physical Exam: BP (!) 193/89   Pulse (!) 106   Temp (!) 96.5 F (35.8 C) (Temporal)   Resp 18   Ht 5\' 5"  (1.651 m)   Wt  59.9 kg   SpO2 99%   BMI 21.97 kg/m  General:   Alert,  pleasant and cooperative in NAD Head:  Normocephalic and atraumatic. Neck:  Supple; no masses or thyromegaly. Lungs:  Clear throughout to auscultation.    Heart:  Regular rate and rhythm. Abdomen:  Soft, nontender and nondistended. Normal bowel sounds, without guarding, and without rebound.   Neurologic:  Alert and  oriented x4;  grossly normal neurologically.  Impression/Plan: Dortha Kern Nienhuis is here for an endoscopy to be performed for dysphagia  Risks, benefits, limitations, and alternatives regarding  endoscopy have been reviewed with the patient.  Questions have been answered.  All parties agreeable.   Lannette Donath, MD  07/28/2023, 10:20 AM

## 2023-07-28 NOTE — Op Note (Signed)
[facility] Patient Name: Sara Jackson Procedure Date: 07/28/2023 10:05 AM MRN: [id number] Account #: [id number] Date of Birth: 1941/03/02 Admit Type: Outpatient Age: 82 Room: [facility] ENDO ROOM 4 Gender: Female Note Status: Finalized Instrument Name: Upper Endoscope [id number] Procedure:             Upper GI endoscopy Indications:           Esophageal dysphagia Providers:             Toney Reil MD, MD Medicines:             General Anesthesia Complications:         No immediate complications. Estimated blood loss: None. Procedure:             Pre-Anesthesia Assessment:                        - Prior to the procedure, a History and Physical was                         performed, and patient medications and allergies were                         reviewed. The patient is competent. The risks and                         benefits of the procedure and the sedation options and                         risks were discussed with the patient. All questions                         were answered and informed consent was obtained.                         Patient identification and proposed procedure were                         verified by the physician, the nurse, the                         anesthesiologist, the anesthetist and the technician                         in the pre-procedure area in the procedure room in the                         endoscopy suite. Mental Status Examination: alert and                         oriented. Airway Examination: normal oropharyngeal                         airway and neck mobility. Respiratory Examination:                         clear to auscultation. CV Examination: normal.                         Prophylactic Antibiotics: The  patient does not require                         prophylactic antibiotics. Prior Anticoagulants: The                         patient has taken no anticoagulant or antiplatelet                          agents. ASA Grade Assessment: II - A patient with mild                         systemic disease. After reviewing the risks and                         benefits, the patient was deemed in satisfactory                         condition to undergo the procedure. The anesthesia                         plan was to use general anesthesia. Immediately prior                         to administration of medications, the patient was                         re-assessed for adequacy to receive sedatives. The                         heart rate, respiratory rate, oxygen saturations,                         blood pressure, adequacy of pulmonary ventilation, and                         response to care were monitored throughout the                         procedure. The physical status of the patient was                         re-assessed after the procedure.                        After obtaining informed consent, the endoscope was                         passed under direct vision. Throughout the procedure,                         the patient's blood pressure, pulse, and oxygen                         saturations were monitored continuously. The Endoscope                         was introduced through the mouth, and advanced to the  second part of duodenum. The upper GI endoscopy was                         accomplished without difficulty. The patient tolerated                         the procedure well. Findings:      The duodenal bulb and second portion of the duodenum were normal.      Diffuse atrophic mucosa was found in the gastric body, at the incisura       and in the gastric antrum. Biopsies were taken with a cold forceps for       histology.      A 3 cm hiatal hernia was present.      Esophagogastric landmarks were identified: the Z-line was found at 36 cm       from the incisors.      A non-obstructing Schatzki ring was found at the gastroesophageal        junction.      The gastroesophageal junction and examined esophagus were normal.       Biopsies were taken with a cold forceps for histology. Impression:            - Normal duodenal bulb and second portion of the                         duodenum.                        - Gastric mucosal atrophy. Biopsied.                        - 3 cm hiatal hernia.                        - Esophagogastric landmarks identified.                        - Non-obstructing Schatzki ring.                        - Normal gastroesophageal junction and esophagus.                         Biopsied. Recommendation:        - Await pathology results.                        - Discharge patient to home (with escort).                        - Chopped diet indefinitely.                        - Continue present medications.                        - Follow an antireflux regimen indefinitely.                        - Use a proton pump inhibitor PO daily. Procedure Code(s):     --- Professional ---  28315, Esophagogastroduodenoscopy, flexible,                         transoral; with biopsy, single or multiple Diagnosis Code(s):     --- Professional ---                        K31.89, Other diseases of stomach and duodenum                        K44.9, Diaphragmatic hernia without obstruction or                         gangrene                        K22.2, Esophageal obstruction                        R13.14, Dysphagia, pharyngoesophageal phase CPT copyright 2022 [employer]. All rights reserved. The codes documented in this report are preliminary and upon coder review may  be revised to meet current compliance requirements. Dr. Libby Maw Toney Reil MD, MD 07/28/2023 10:31:02 AM This report has been signed electronically. Number of Addenda: 0 Note Initiated On: 07/28/2023 10:05 AM Estimated Blood Loss:  Estimated blood loss: none.      [facility]

## 2023-07-29 ENCOUNTER — Encounter: Payer: Self-pay | Admitting: Gastroenterology

## 2023-07-29 LAB — SURGICAL PATHOLOGY

## 2023-08-06 ENCOUNTER — Telehealth: Payer: Self-pay

## 2023-08-06 NOTE — Telephone Encounter (Signed)
Pt requesting call back for results of upper GI

## 2023-08-09 NOTE — Telephone Encounter (Signed)
Per Dr. Allegra Lai Please inform patient that the pathology results from upper endoscopy came back normal.  Because of the presence of hiatal hernia, I recommend her to stay on omeprazole 40 mg once daily before meals for the rest of her life.   RV  Called patient and patient verbalized understanding of results

## 2023-09-06 ENCOUNTER — Ambulatory Visit: Payer: Medicare Other | Admitting: Physician Assistant

## 2023-09-06 VITALS — BP 130/77 | Ht 65.0 in | Wt 132.0 lb

## 2023-09-06 DIAGNOSIS — C4359 Malignant melanoma of other part of trunk: Secondary | ICD-10-CM

## 2023-09-06 DIAGNOSIS — K529 Noninfective gastroenteritis and colitis, unspecified: Secondary | ICD-10-CM

## 2023-11-15 ENCOUNTER — Other Ambulatory Visit: Payer: Self-pay | Admitting: Family Medicine

## 2023-11-15 DIAGNOSIS — Z780 Asymptomatic menopausal state: Secondary | ICD-10-CM

## 2023-11-24 ENCOUNTER — Ambulatory Visit
Admission: RE | Admit: 2023-11-24 | Discharge: 2023-11-24 | Disposition: A | Discharge status: Home / Self Care | Payer: Medicare Other | Source: Ambulatory Visit | Attending: Family Medicine | Admitting: Family Medicine

## 2023-11-24 DIAGNOSIS — Z780 Asymptomatic menopausal state: Secondary | ICD-10-CM | POA: Diagnosis present

## 2024-04-17 ENCOUNTER — Encounter: Payer: Self-pay | Admitting: Physician Assistant

## 2024-04-17 NOTE — Progress Notes (Signed)
 Patient ID: Sara Jackson, female   DOB: 09-28-1941, 83 y.o.   MRN: [id number]  Visit Canceled.

## 2024-04-27 ENCOUNTER — Emergency Department
Admission: EM | Admit: 2024-04-27 | Discharge: 2024-04-27 | Disposition: A | Discharge status: Home / Self Care | Attending: Emergency Medicine | Admitting: Emergency Medicine

## 2024-04-27 ENCOUNTER — Encounter: Payer: Self-pay | Admitting: Emergency Medicine

## 2024-04-27 ENCOUNTER — Emergency Department

## 2024-04-27 ENCOUNTER — Ambulatory Visit
Admission: EM | Admit: 2024-04-27 | Discharge: 2024-04-27 | Disposition: A | Discharge status: Home / Self Care | Payer: PRIVATE HEALTH INSURANCE

## 2024-04-27 ENCOUNTER — Other Ambulatory Visit: Payer: Self-pay

## 2024-04-27 ENCOUNTER — Encounter: Payer: Self-pay | Admitting: *Deleted

## 2024-04-27 DIAGNOSIS — H8112 Benign paroxysmal vertigo, left ear: Secondary | ICD-10-CM | POA: Insufficient documentation

## 2024-04-27 DIAGNOSIS — R42 Dizziness and giddiness: Secondary | ICD-10-CM

## 2024-04-27 DIAGNOSIS — E876 Hypokalemia: Secondary | ICD-10-CM | POA: Diagnosis not present

## 2024-04-27 DIAGNOSIS — I169 Hypertensive crisis, unspecified: Secondary | ICD-10-CM

## 2024-04-27 DIAGNOSIS — I10 Essential (primary) hypertension: Secondary | ICD-10-CM | POA: Diagnosis not present

## 2024-04-27 LAB — CBC
HCT: 37.5 % (ref 36.0–46.0)
Hemoglobin: 12.1 g/dL (ref 12.0–15.0)
MCH: 30.1 pg (ref 26.0–34.0)
MCHC: 32.3 g/dL (ref 30.0–36.0)
MCV: 93.3 fL (ref 80.0–100.0)
Platelets: 293 10*3/uL (ref 150–400)
RBC: 4.02 MIL/uL (ref 3.87–5.11)
RDW: 12.1 % (ref 11.5–15.5)
WBC: 8 10*3/uL (ref 4.0–10.5)
nRBC: 0 % (ref 0.0–0.2)

## 2024-04-27 LAB — URINALYSIS, ROUTINE W REFLEX MICROSCOPIC
Bacteria, UA: NONE SEEN
Bilirubin Urine: NEGATIVE
Glucose, UA: NEGATIVE mg/dL
Hgb urine dipstick: NEGATIVE
Ketones, ur: NEGATIVE mg/dL
Leukocytes,Ua: NEGATIVE
Nitrite: POSITIVE — AB
Protein, ur: NEGATIVE mg/dL
Specific Gravity, Urine: 1.013 (ref 1.005–1.030)
pH: 5 (ref 5.0–8.0)

## 2024-04-27 LAB — BASIC METABOLIC PANEL WITH GFR
Anion gap: 11 (ref 5–15)
BUN: 24 mg/dL — ABNORMAL HIGH (ref 8–23)
CO2: 30 mmol/L (ref 22–32)
Calcium: 9.3 mg/dL (ref 8.9–10.3)
Chloride: 98 mmol/L (ref 98–111)
Creatinine, Ser: 1.03 mg/dL — ABNORMAL HIGH (ref 0.44–1.00)
GFR, Estimated: 54 mL/min — ABNORMAL LOW (ref >60)
Glucose, Bld: 121 mg/dL — ABNORMAL HIGH (ref 70–99)
Potassium: 3.1 mmol/L — ABNORMAL LOW (ref 3.5–5.1)
Sodium: 139 mmol/L (ref 135–145)

## 2024-04-27 LAB — MAGNESIUM: Magnesium: 2 mg/dL (ref 1.7–2.4)

## 2024-04-27 LAB — TROPONIN I (HIGH SENSITIVITY)
Troponin I (High Sensitivity): 18 ng/L — ABNORMAL HIGH (ref <18)
Troponin I (High Sensitivity): 20 ng/L — ABNORMAL HIGH (ref <18)

## 2024-04-27 MED ORDER — MECLIZINE HCL 25 MG PO TABS: 25.0000 mg | ORAL_TABLET | Freq: Three times a day (TID) | ORAL | 0 refills | Status: DC | PRN

## 2024-04-27 MED ORDER — POTASSIUM CHLORIDE CRYS ER 20 MEQ PO TBCR
40.0000 meq | EXTENDED_RELEASE_TABLET | Freq: Once | ORAL | Status: AC
  Administered 2024-04-27: 40 meq via ORAL
  Filled 2024-04-27: qty 2

## 2024-04-27 MED ORDER — MECLIZINE HCL 25 MG PO TABS
25.0000 mg | ORAL_TABLET | Freq: Once | ORAL | Status: AC
  Administered 2024-04-27: 25 mg via ORAL
  Filled 2024-04-27: qty 1

## 2024-04-27 MED ORDER — ACETAMINOPHEN 500 MG PO TABS
1000.0000 mg | ORAL_TABLET | Freq: Once | ORAL | Status: AC
  Administered 2024-04-27: 1000 mg via ORAL
  Filled 2024-04-27: qty 2

## 2024-04-27 MED ORDER — LACTATED RINGERS IV BOLUS
500.0000 mL | Freq: Once | INTRAVENOUS | Status: AC
  Administered 2024-04-27: 500 mL via INTRAVENOUS

## 2024-04-27 NOTE — ED Notes (Signed)
 Patient to CT.

## 2024-04-27 NOTE — ED Provider Notes (Signed)
 [facility]    CSN: [id number] Arrival date & time: 04/27/24  1533      History   Chief Complaint Chief Complaint  Patient presents with   Hypertension   Dizziness    HPI Sara Jackson is a 83 y.o. female.   83 year old female, Sara Jackson, presents to urgent care for evaluation of dizziness, headache and elevated blood pressure that started today at lunch, pt states she does not feel right in her head.  Patient has noticeable tremors and reports this is normal but worsens when she is sick.  Patient reports she has not felt like this before.  Patient denies any chest pain,SOB,  or palpitations  Pmh: Hypertension, arthritis, GERD, malignant melanoma, hypercholesterolemia  The history is provided by the patient. No language interpreter was used.    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   Vaginal atrophy     Patient Active Problem List   Diagnosis Date Noted   Hypertensive crisis 04/27/2024   Dizziness 04/27/2024   Esophageal dysphagia 07/28/2023   Problems with swallowing and mastication    Hiatal hernia    Stricture and stenosis of esophagus    Yeast vaginitis 07/09/2015   AP (abdominal pain) 07/04/2015   Vaginal atrophy 07/04/2015   Bladder pain 07/04/2015   Cystocele, midline 07/04/2015   IC (interstitial cystitis) 07/04/2015   Pain in thoracic spine 02/21/2015   Impingement syndrome of shoulder 10/18/2014   Benign  paroxysmal positional nystagmus 07/11/2014   Essential (primary) hypertension 07/11/2014   Gastro-esophageal reflux disease without esophagitis 07/11/2014   Glaucoma 07/11/2014   Hypercholesteremia 07/11/2014   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   Low back pain 05/02/2014   Bursitis, trochanteric 05/02/2014   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    Past Surgical History:  Procedure Laterality Date   BIOPSY  07/28/2023   Procedure: BIOPSY;  Surgeon: Unk Corinn Skiff, MD;  Location: [facility] ENDOSCOPY;  Service: Gastroenterology;;   BREAST BIOPSY Left    CATARACT EXTRACTION W/PHACO Right 08/03/2022   Procedure: CATARACT EXTRACTION PHACO AND INTRAOCULAR LENS PLACEMENT (IOC) RIGHT CLARION VIVITY TORIC  LENS  4.49 00:31.4;  Surgeon: Myrna Adine Anes, MD;  Location: [facility];  Service: Ophthalmology;  Laterality: Right;   CATARACT EXTRACTION W/PHACO Left 08/17/2022   Procedure: CATARACT EXTRACTION PHACO AND INTRAOCULAR LENS PLACEMENT (IOC) LEFT CLARION VIVITY TORIC LENS HYDRUS MICROSTENT;  Surgeon: Myrna Adine Anes, MD;  Location: [facility];  Service: Ophthalmology;  Laterality: Left;  4.49 0:37.0   ESOPHAGOGASTRODUODENOSCOPY (EGD) WITH PROPOFOL  N/A 03/12/2016   Procedure: ESOPHAGOGASTRODUODENOSCOPY (EGD) WITH PROPOFOL  with dilation;  Surgeon: Rogelia Copping, MD;  Location: [facility];  Service: Endoscopy;  Laterality: N/A;   ESOPHAGOGASTRODUODENOSCOPY (EGD) WITH PROPOFOL  N/A 07/28/2023   Procedure: ESOPHAGOGASTRODUODENOSCOPY (EGD) WITH PROPOFOL ;  Surgeon: Unk Corinn Skiff, MD;  Location: [facility] ENDOSCOPY;  Service: Gastroenterology;  Laterality: N/A;   MELANOMA EXCISION     RADICAL HYSTERECTOMY  OB History   No obstetric history on file.      Home Medications    Prior to Admission medications   Medication Sig Start Date End Date Taking? Authorizing Provider  estradiol (ESTRACE) 1 MG tablet Take 0.5 mg by mouth.  02/24/16  Yes [provider]  latanoprost (XALATAN) 0.005 % ophthalmic solution 1 drop at bedtime.   Yes [provider]  lisinopril-hydrochlorothiazide (ZESTORETIC) 20-12.5 MG tablet Take 1 tablet by mouth daily.   Yes [provider]  pantoprazole (PROTONIX) 40 MG tablet Take 40 mg by mouth 2 (two) times daily. 03/10/24  Yes [provider]  acetaminophen  (TYLENOL ) 325 MG tablet Take by mouth. 07/11/14   [provider]  Cholecalciferol (VITAMIN D3) 50 MCG (2000 UT) capsule Take 2,000 Units by mouth daily.    [provider]  Multiple Vitamins-Minerals (CENTRUM SILVER PO) Take by mouth daily.    [provider]  omeprazole  (PRILOSEC) 40 MG capsule Take 1 capsule (40 mg total) by mouth daily before breakfast. 07/28/23 10/26/23  Unk Corinn Skiff, MD    Family History Family History  Problem Relation Age of Onset   Cancer Mother    Cerebral aneurysm Father    COPD Half-Brother    Dementia Half-Sister    Kidney cancer Neg Hx    Bladder Cancer Neg Hx     Social History Social History   Tobacco Use   Smoking status: Never   Smokeless tobacco: Never  Vaping Use   Vaping status: Never Used  Substance Use Topics   Alcohol use: No    Alcohol/week: 0.0 standard drinks of alcohol   Drug use: No     Allergies   Azo [phenazopyridine], Colesevelam, Colesevelam hcl, Doxycycline hyclate, Ezetimibe, Fluoxetine, Other, Statins, Ace inhibitors, Doxycycline, Estradiol, Nsaids, and Risedronate sodium   Review of Systems Review of Systems  Constitutional:  Negative for fever.  Cardiovascular:  Negative for chest pain and palpitations.  Neurological:  Positive for dizziness and headaches.  All other systems reviewed and are negative.    Physical Exam Triage Vital Signs ED Triage Vitals [04/27/24 1541]  Encounter Vitals Group     BP      Girls Systolic BP Percentile      Girls Diastolic BP Percentile      Boys Systolic BP Percentile      Boys  Diastolic BP Percentile      Pulse      Resp      Temp      Temp src      SpO2      Weight      Height      Head Circumference      Peak Flow      Pain Score 2     Pain Loc      Pain Education      Exclude from Growth Chart    No data found.  Updated Vital Signs BP (!) 183/95 (BP Location: Right Arm)   Pulse 93   Temp 98.1 F (36.7 C) (Oral)   Resp 16   SpO2 95%   Visual Acuity Right Eye Distance:   Left Eye Distance:   Bilateral Distance:    Right Eye Near:   Left Eye Near:    Bilateral Near:     Physical Exam Vitals and nursing note reviewed.   Eyes:     Pupils: Pupils are equal, round, and reactive to light.    Cardiovascular:  Rate and Rhythm: Normal rate and regular rhythm.     Heart sounds: Normal heart sounds.  Pulmonary:     Effort: Pulmonary effort is normal.   Neurological:     General: No focal deficit present.     Mental Status: She is alert and oriented to person, place, and time.     GCS: GCS eye subscore is 4. GCS verbal subscore is 5. GCS motor subscore is 6.     Sensory: Sensation is intact.     Motor: Motor function is intact.     Coordination: Coordination is intact.     Comments: Pt feels off balance/unsteady  Psychiatric:        Attention and Perception: Attention normal.        Mood and Affect: Mood normal.        Speech: Speech normal.        Behavior: Behavior is cooperative.      UC Treatments / Results  Labs (all labs ordered are listed, but only abnormal results are displayed) Labs Reviewed - No data to display  EKG   Radiology    Procedures Procedures (including critical care time)  Medications Ordered in UC Medications - No data to display  Initial Impression / Assessment and Plan / UC Course  I have reviewed the triage vital signs and the nursing notes.  Pertinent labs & imaging results that were available during my care of the patient were reviewed by me and considered in my medical decision making  (see chart for details).  Clinical Course as of 04/27/24 2128  Thu Apr 27, 2024  1544 EKG ordered for dizziness [JD]  1552 Poor EKG capture/artifact as patient has significant tremors which she states is normal but worsens when she is sick.  EKG shows normal sinus rhythm rate 92, QTc 457, ? EKG changes V5 as compared to 04/23 EKG [JD]  1612 Report called to ER, spoke with triage nurse Maggie. [JD]    Clinical Course User Index [JD] Talonda Artist, Rilla, NP   Discussed exam findings and plan of care with patient and daughter, recommend further evaluation with labs, and CT scan at ER, both verbalized understanding to this provider, daughter will drive patient POV to [facility], report called.  Ddx: Hypertensive crisis, dizziness, headache, ACS,CVA/TIA, electrolyte imbalance Final Clinical Impressions(s) / UC Diagnoses   Final diagnoses:  Hypertensive crisis  Dizziness     Discharge Instructions      Go to Er for further evaluation of dizziness, hypertensive crisis     ED Prescriptions   None    PDMP not reviewed this encounter.   Aminta Rilla, NP 04/27/24 2131

## 2024-04-27 NOTE — ED Provider Notes (Signed)
 [facility] Provider Note    Event Date/Time   First MD Initiated Contact with Patient 04/27/24 1931     (approximate)   History   Hypertension   HPI  Sara Jackson is a 83 y.o. female who presents to the ED for evaluation of Hypertension   Review of cardiology clinic visit from 2 years ago.  Nothing recent that I can see.  History of HTN, degenerative lumbar disc disease was evaluated for dizziness.  Patient presents to the ED after an episode of dizziness at home, subsequently checking her blood pressure noted to be elevated to 150s-170s.  Reports that she felt fine this morning when she got up.  Subsequently stood from seated position, to go out to lunch with her family, she felt some unsteadiness without presyncope.  This self resolved but she checked her blood pressure and noted to be elevated.  Went to an urgent care who then referred her to us   When I evaluate her she reports feeling well   Physical Exam   Triage Vital Signs: ED Triage Vitals  Encounter Vitals Group     BP 04/27/24 1652 (!) 168/74     Girls Systolic BP Percentile --      Girls Diastolic BP Percentile --      Boys Systolic BP Percentile --      Boys Diastolic BP Percentile --      Pulse Rate 04/27/24 1652 87     Resp 04/27/24 1652 18     Temp 04/27/24 1652 98 F (36.7 C)     Temp Source 04/27/24 1652 Oral     SpO2 04/27/24 1652 96 %     Weight 04/27/24 1653 130 lb (59 kg)     Height 04/27/24 1653 5' 5 (1.651 m)     Head Circumference --      Peak Flow --      Pain Score 04/27/24 1653 0     Pain Loc --      Pain Education --      Exclude from Growth Chart --     Most recent vital signs: Vitals:   04/27/24 2031 04/27/24 2156  BP: (!) 155/75 (!) 156/69  Pulse: 73 70  Resp: 16 14  Temp: 97.6 F (36.4 C)   SpO2: 99% 100%    General: Awake, no distress.  CV:  Good peripheral perfusion.  Resp:  Normal effort.  Abd:  No distention.  MSK:  No deformity  noted.  Neuro:  No focal deficits appreciated. Cranial nerves II through XII intact 5/5 strength and sensation in all 4 extremities Other:     ED Results / Procedures / Treatments   Labs (all labs ordered are listed, but only abnormal results are displayed) Labs Reviewed  BASIC METABOLIC PANEL WITH GFR - Abnormal; Notable for the following components:      Result Value   Potassium 3.1 (*)    Glucose, Bld 121 (*)    BUN 24 (*)    Creatinine, Ser 1.03 (*)    GFR, Estimated 54 (*)    All other components within normal limits  URINALYSIS, ROUTINE W REFLEX MICROSCOPIC - Abnormal; Notable for the following components:   Color, Urine AMBER (*)    APPearance CLEAR (*)    Nitrite POSITIVE (*)    All other components within normal limits  TROPONIN I (HIGH SENSITIVITY) - Abnormal; Notable for the following components:   Troponin I (High Sensitivity) 18 (*)  All other components within normal limits  TROPONIN I (HIGH SENSITIVITY) - Abnormal; Notable for the following components:   Troponin I (High Sensitivity) 20 (*)    All other components within normal limits  CBC  MAGNESIUM    EKG Sinus rhythm with a rate of 89 bpm.  First-degree AV block with PR interval 256.  No high-grade AV block.  No for signs of acute ischemia.  Nonspecific ST changes inferiorly or laterally.  Only comparison EKG from 2 years ago is of a fairly similar morphology  RADIOLOGY CT head interpreted by me without evidence of acute intracranial pathology CXR interpreted by me without evidence of acute cardiopulmonary pathology.  Official radiology report(s): CT HEAD WO CONTRAST ( ) Result Date: 04/27/2024 CLINICAL DATA:  hypertension, headache and dizziness. eval ich, cva EXAM: CT HEAD WITHOUT CONTRAST TECHNIQUE: Contiguous axial images were obtained from the base of the skull through the vertex without intravenous contrast. RADIATION DOSE REDUCTION: This exam was performed according to the departmental  dose-optimization program which includes automated exposure control, adjustment of the mA and/or kV according to patient size and/or use of iterative reconstruction technique. COMPARISON:  MRI head 10/23/2015 FINDINGS: Brain: No evidence of large-territorial acute infarction. No parenchymal hemorrhage. No mass lesion. No extra-axial collection. No mass effect or midline shift. No hydrocephalus. Basilar cisterns are patent. Empty sella. Atherosclerotic calcifications are present within the cavernous internal carotid arteries. Vascular: No hyperdense vessel. Skull: No acute fracture or focal lesion. Sinuses/Orbits: Paranasal sinuses and mastoid air cells are clear. Bilateral lens replacement. Otherwise the orbits are unremarkable. Other: None. IMPRESSION: 1. No acute intracranial abnormality. 2. Empty sella. Findings is often a normal anatomic variant but can be associated with idiopathic intracranial hypertension (pseudotumor cerebri). Electronically Signed   By: Morgane  Naveau M.D.   On: 04/27/2024 20:32   DG Chest 2 View Result Date: 04/27/2024 CLINICAL DATA:  Dizziness, hypertension. EXAM: CHEST - 2 VIEW COMPARISON:  Mar 04, 2023.  November 22, 2018. FINDINGS: The heart size and mediastinal contours are within normal limits. Stable left apical pulmonary nodule is noted as described on prior CT scan. No acute pulmonary disease is noted. The visualized skeletal structures are unremarkable. IMPRESSION: No active cardiopulmonary disease. Electronically Signed   By: Lynwood Landy Raddle M.D.   On: 04/27/2024 17:24    PROCEDURES and INTERVENTIONS:  .1-3 Lead EKG Interpretation  Performed by: Claudene Rover, MD Authorized by: Claudene Rover, MD     Interpretation: normal     ECG rate:  70   ECG rate assessment: normal     Rhythm: sinus rhythm     Ectopy: none     Conduction: normal     Medications  potassium chloride SA (KLOR-CON M) CR tablet 40 mEq (40 mEq Oral Given 04/27/24 1945)  lactated ringers  bolus 500  mL (0 mLs Intravenous Stopped 04/27/24 2202)  acetaminophen  (TYLENOL ) tablet 1,000 mg (1,000 mg Oral Given 04/27/24 2058)  meclizine (ANTIVERT) tablet 25 mg (25 mg Oral Given 04/27/24 2152)     IMPRESSION / MDM / ASSESSMENT AND PLAN / ED COURSE  I reviewed the triage vital signs and the nursing notes.  Differential diagnosis includes, but is not limited to, BPPV, presyncope or vasovagal, cardiac dysrhythmia, sepsis or hypovolemia, electrolyte derangement or AKI  {Patient presents with symptoms of an acute illness or injury that is potentially life-threatening.  Patient presents from home after a brief episode of unsteadiness/vertigo.  Looks well to me now with an intact neurologic exam.  No  signs of trauma or deficits.  Mild hypokalemia is replaced orally.  No recent renal function comparison but may be a degree of prerenal azotemia so she gets a 500 bolus of LR.  Troponins are flat.  UA pending I suspect she will be suitable for outpatient management.  UA without infectious features.  As below has some persistent positional vertigo that I suspect is of BPPV etiology.  Discussed outpatient management with meclizine and ENT follow-up.  Clinical Course as of 04/27/24 2341  Thu Apr 27, 2024  2159 Reassessed.  Patient reports feeling better.  I set her up the edge of the bed to stand and she reports some mild persistent vertigo that is positional.  Reexamine her eyes and neuroexam alongside this.  We discussed BPPV, meclizine, ENT follow-up.  She is eager to go ahead and get out of here.  Will provide meclizine before discharge.  Discussed return precautions, expectant management [DS]    Clinical Course User Index [DS] Claudene Rover, MD     FINAL CLINICAL IMPRESSION(S) / ED DIAGNOSES   Final diagnoses:  Dizziness  Hypertension, unspecified type  Benign paroxysmal positional vertigo of left ear     Rx / DC Orders   ED Discharge Orders          Ordered    meclizine (ANTIVERT) 25 MG  tablet  3 times daily PRN        04/27/24 2204             Note:  This document was prepared using Dragon voice recognition software and may include unintentional dictation errors.   Claudene Rover, MD 04/27/24 [phone]

## 2024-04-27 NOTE — ED Triage Notes (Signed)
 Pt presents with dizziness, some headache and hypertension that started today.

## 2024-04-27 NOTE — ED Triage Notes (Signed)
 Pt to triage via wheelchair.  Pt has htn.  Pt has taken bp meds today.  Pt denies chest pain and sob.  Pt has dizziness and feels weak.   No n/v   No headache.  Pt alert speech clear.

## 2024-04-27 NOTE — Discharge Instructions (Signed)
 Go to Er for further evaluation of dizziness, hypertensive crisis

## 2024-04-27 NOTE — Discharge Instructions (Addendum)
 Use Tylenol  for pain and fevers.  Up to 1000 mg per dose, up to 4 times per day.  Do not take more than 4000 mg of Tylenol /acetaminophen  within 24 hours  Try using the meclizine/Antivert medication to help with vertigo/dizziness.  Use up to 3 times per day as needed.  If the symptoms persist despite the medications please reach out to the ENT clinic  If you have worsening symptoms please return to the ED

## 2024-04-30 NOTE — ED Provider Notes (Signed)
 [facility] Emergency Department Provider Note    ED Clinical Impression    Final diagnoses:  Lightheadedness (Primary)  Dehydration        Impression, Medical Decision Making, Progress Notes and Critical Care    Impression, Differential Diagnosis and Plan of Care  With history of hypertension, presenting for evaluation of lightheaded dizziness.  On examination the patient is in no acute distress.  Ddx at this time include but not limited to: dehydration, anemia, electrolyte derangement, arrhythmia, among others. Recommended evaluation with CBC, CMP, EKG, ambulatory pulse ox and orthostatic vital signs.  Labs reviewed and are within normal limits without significant electrolyte derangement or anemia.  EKG normal sinus rhythm.  Ambulatory pulse ox was normal without significant tachycardia or hypoxia with ambulation.  Orthostatic vital signs were positive.  Recommended increase p.o. and IV hydration.  Patient tolerated liter of normal saline well.  Second liter of normal saline offered but the patient declined, stating that she would feel comfortable hydrating at home.  Recommended increase of p.o. fluid intake with 8 to 10 cups of fluid daily, and reassessment by PCP.  Discussed return precautions for any development of chest pain, shortness of breath, syncope, recurrent vomiting or any other concerning symptoms.  Patient verbalized understanding and agreed.  Independent Interpretation of Studies  I have independently interpreted the following studies: EKG: NSR  Portions of this record have been created using Scientist, clinical (histocompatibility and immunogenetics). Dictation errors have been sought, but may not have been identified and corrected.  See chart and resident provider documentation for details.  ____________________________________________     HISTORY     Reason for Visit Dizziness   HPI  Sara Jackson is a 83 y.o. female with history of HTN, who presents for evaluation  of 2-day history of lightheaded dizziness.  Notes this occurs when she stands up and walks.  She denies chest pain, shortness of breath, nausea, vomiting, diarrhea.  Was evaluated at outside emergency department for same, given a liter of normal saline, and is patient's understanding that she did not have to increase fluid intake at home.  She drinks about 4 cups of water  a day.  She notes that if she drinks any more she has to continue to urinate frequently.  Patient states that the day of her lightheaded dizziness, short lightheaded, sat down took her blood pressure and it was slightly elevated above her baseline with a systolic of 140, she notes that she repetitively kept checking the blood pressure and it kept increasing and that made her concerned.  She states that that day, she took 2 extra doses of her blood pressure medication which includes hydrochlorothiazide.  No past medical history on file.  There is no problem list on file for this patient.   No past surgical history on file.  No current facility-administered medications for this encounter. No current outpatient medications on file.  Allergies Colesevelam, Ezetimibe, Fluoxetine, Lansoprazole, Nsaids (non-steroidal anti-inflammatory drug), Other, Risedronate, Statins-hmg-coa reductase inhibitors, Ace inhibitors, Doxycycline, Estradiol, Phenazopyridine, and Risedronate sodium  Family History[1]  Social History Short Social History[2]   PHYSICAL EXAM    ED Triage Vitals  Enc Vitals Group     BP 04/30/24 0907 147/66     Pulse 04/30/24 0907 92     SpO2 Pulse 04/30/24 0928 82     Resp 04/30/24 0907 18     Temp 04/30/24 0907 36.1 C (97 F)     Temp Source 04/30/24 0907 Temporal  SpO2 04/30/24 0907 97 %     Weight 04/30/24 0909 59.4 kg (131 lb)     Height --      Head Circumference --      Peak Flow --      Pain Score --      Pain Loc --      Pain Education --      Exclude from Growth Chart --     General: Alert,  no acute distress. Speaks in full sentences without difficulty. Skin: Warm, dry. Head: Normocephalic, atraumatic. Neck: Trachea midline. No nuchal rigidity or meningismus. Eye: Pupils are equal, round and reactive to light, normal conjunctiva. Ears, nose, mouth and throat:  oral mucosa moist, no pharyngeal erythema or exudate. Cardiovascular: Regular rate and rhythm, Normal peripheral perfusion, No edema. Respiratory: Lungs are clear to auscultation, respirations are non-labored, breath sounds are equal. Chest wall: No deformity. Back: Normal range of motion. Musculoskeletal: Normal ROM, normal strength. No gross deformity noted. Gastrointestinal: Non distended. Neurological: Alert and oriented to person, place, time, and situation, No focal neurological deficit observed. Psychiatric: Cooperative, appropriate mood & affect.     RESULTS    Labs   Results for orders placed or performed during the hospital encounter of 04/30/24  Comprehensive Metabolic Panel  Result Value Ref Range   Sodium 141 135 - 145 mmol/L   Potassium     Chloride 100 98 - 107 mmol/L   CO2 28.6 20.0 - 31.0 mmol/L   Anion Gap 12 5 - 14 mmol/L   BUN     Creatinine     eGFR CKD-EPI (2021) Female     Glucose 140 70 - 179 mg/dL   Calcium 9.5 8.7 - 89.5 mg/dL   Albumin 3.7 3.4 - 5.0 g/dL   Total Protein 7.3 5.7 - 8.2 g/dL   Total Bilirubin 0.7 0.3 - 1.2 mg/dL   AST 51 (H) <=65 U/L   ALT     Alkaline Phosphatase 51 46 - 116 U/L  Magnesium  Result Value Ref Range   Magnesium 2.0 1.6 - 2.6 mg/dL  Comprehensive metabolic panel  Result Value Ref Range   Sodium 142 135 - 145 mmol/L   Potassium 4.2 3.4 - 4.8 mmol/L   Chloride 100 98 - 107 mmol/L   CO2 28.9 20.0 - 31.0 mmol/L   Anion Gap 13 5 - 14 mmol/L   BUN 13 9 - 23 mg/dL   Creatinine 9.06 9.44 - 1.02 mg/dL   BUN/Creatinine Ratio 14    eGFR CKD-EPI (2021) Female 61 >=60 mL/min/1.4m2   Glucose 114 70 - 179 mg/dL   Calcium 9.4 8.7 - 89.5 mg/dL   Albumin  3.7 3.4 - 5.0 g/dL   Total Protein 7.1 5.7 - 8.2 g/dL   Total Bilirubin 0.5 0.3 - 1.2 mg/dL   AST 25 <=65 U/L   ALT 10 10 - 49 U/L   Alkaline Phosphatase 60 46 - 116 U/L  ECG 12 Lead  Result Value Ref Range   EKG Systolic BP  mmHg   EKG Diastolic BP  mmHg   EKG Ventricular Rate 92 BPM   EKG Atrial Rate 92 BPM   EKG P-R Interval 142 ms   EKG QRS Duration 82 ms   EKG Q-T Interval 356 ms   EKG QTC Calculation 440 ms   EKG Calculated P Axis 75 degrees   EKG Calculated R Axis 71 degrees   EKG Calculated T Axis 82 degrees   QTC Fredericia 410 ms  CBC w/ Differential  Result Value Ref Range   WBC 6.9 3.6 - 11.2 10*9/L   RBC 4.10 3.95 - 5.13 10*12/L   HGB 12.8 11.3 - 14.9 g/dL   HCT 62.9 65.9 - 55.9 %   MCV 90.3 77.6 - 95.7 fL   MCH 31.1 25.9 - 32.4 pg   MCHC 34.5 32.0 - 36.0 g/dL   RDW 87.1 87.7 - 84.7 %   MPV 8.4 6.8 - 10.7 fL   Platelet 256 150 - 450 10*9/L   nRBC 0 <=4 /100 WBCs   Neutrophils % 68.6 %   Lymphocytes % 22.8 %   Monocytes % 6.5 %   Eosinophils % 1.0 %   Basophils % 1.1 %   Absolute Neutrophils 4.8 1.8 - 7.8 10*9/L   Absolute Lymphocytes 1.6 1.1 - 3.6 10*9/L   Absolute Monocytes 0.5 0.3 - 0.8 10*9/L   Absolute Eosinophils 0.1 0.0 - 0.5 10*9/L   Absolute Basophils 0.1 0.0 - 0.1 10*9/L  Urinalysis with Microscopy with Culture Reflex  Result Value Ref Range   Color, UA Light Yellow    Clarity, UA Clear    Specific Gravity, UA 1.013 1.003 - 1.030   pH, UA 6.0 5.0 - 9.0   Leukocyte Esterase, UA Negative Negative   Nitrite, UA Negative Negative   Protein, UA Negative Negative   Glucose, UA Negative Negative   Ketones, UA Negative Negative   Urobilinogen, UA <2.0 mg/dL <7.9 mg/dL   Bilirubin, UA Negative Negative   Blood, UA Negative Negative   RBC, UA 1 <=4 /HPF   WBC, UA 2 0 - 5 /HPF   Squam Epithel, UA 1 0 - 5 /HPF   Bacteria, UA None Seen None Seen /HPF   Mucus, UA Rare (A) None Seen /HPF     Radiology   ECG 12 Lead Result Date:  04/30/2024 NORMAL SINUS RHYTHM NORMAL ECG NO PREVIOUS ECGS AVAILABLE   Medications administered this visit   @MEDADMIN @ Procedures   Procedures         Georjean Falling [place], [place] 04/30/24 1651      [1] History reviewed. No pertinent family history. [2]

## 2024-04-30 NOTE — ED Triage Notes (Signed)
 Pt coming inPOV for dizziness since Friday 04/28/2024. Pt noticed that her BP was elevating slowly. Pt was prescribed vertigo medication which seemed to make the dizziness worse. When laying down and resting, she is okay, however when she stands she gets unsteady. Was seen @ [facility] Friday.

## 2024-05-01 NOTE — Progress Notes (Signed)
 Chief Complaint:   Chief Complaint  Patient presents with  . New Patient  . Foot Problem    Ingrown 5th toenail, great toenail    Subjective  HPI  Sara Jackson is a 83 y.o. female who presents for New Patient and Foot Problem (Ingrown 5th toenail, great toenail) History of Present Illness Sara Jackson is an 83 year old female who presents with an ingrown toenail and a broken toenail.  She has had an ingrown toenail on the lateral aspect of her left pinky toe for approximately three months. There is no associated redness or drainage. The anatomical rotation of the toe contributes to the prominence of the condyle, which may exacerbate the condition.  Additionally, she has a broken toenail on her big toe that is barely hanging on. There is no mention of how long this has been an issue or any associated symptoms.  Review of Systems  Patient Active Problem List  Diagnosis  . Melanoma of buttock (CMS/HHS-HCC)  . Low back pain with radiation  . BPV (benign positional vertigo)  . Hypercholesterolemia  . Schatzki's ring  . Glaucoma  . Essential hypertension  . Impingement syndrome of left shoulder  . Thoracic spine pain  . Cystocele, midline  . Gastro-esophageal reflux disease without esophagitis  . 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 Postmenopausal atrophic vaginitis  . Trochanteric bursitis of left hip  . Cyst of right ovary  . Pulmonary nodule, right  . Chronic LLQ pain  . Heme positive stool  . Hormone replacement therapy  . Acquired deformity of right toe  . Acquired deformity of left toe  . Exostosis of toe    Outpatient Medications Prior to Visit  Medication Sig Dispense Refill  . acetaminophen  (TYLENOL ) 325 MG tablet Take 2 tablets (650 mg total) by mouth every 4 (four) hours as needed for Pain. 90 tablet 1  . calcium carbonate  (TUMS E-X) 300 mg (750 mg) chewable tablet Take 300 mg of elemental by mouth every 2 (two) hours as needed for Heartburn.    . cholecalciferol (VITAMIN D3) 2,000 unit capsule Take 2,000 Units by mouth once daily.    SABRA estradiol (ESTRACE) 1 MG tablet TAKE 1 TABLET BY MOUTH EVERY DAY 90 tablet 0  . latanoprost (XALATAN) 0.005 % ophthalmic solution INSTILL 1 DROP INTO BOTH EYES NIGHTLY 10 mL 0  . lisinopril-hydrochlorothiazide (PRINZIDE,ZESTORETIC) 20-12.5 mg tablet TAKE 1 TABLET BY MOUTH EVERY DAY 90 tablet 0  . meclizine  (ANTIVERT ) 25 mg tablet Take 25 mg by mouth 3 (three) times daily as needed    . multivitamin-lutein (CENTRUM SILVER) tablet Take 1 tablet by mouth once daily    . omeprazole  (PRILOSEC) 40 MG DR capsule Take 40 mg by mouth every morning before breakfast    . cetirizine (ZYRTEC) 10 MG tablet TAKE 1 TABLET BY MOUTH DAILY (Patient not taking: Reported on 05/01/2024) 30 tablet 5  . conjugated estrogens (PREMARIN) 0.625 mg/gram vaginal cream Place 0.5 g vaginally once daily. (Patient not taking: Reported on 05/01/2024) 30 g 2  . fluticasone (FLONASE) 50 mcg/actuation nasal spray Place 2 sprays into both nostrils once daily as needed for Rhinitis (Patient not taking: Reported on 05/01/2024) 30 g prn  . Lactobacillus acidophilus (PROBIOTIC) 10 billion cell Cap Take by mouth. (Patient not taking: Reported on 05/01/2024)    . lidocaine -diphenhydramine-aluminum-magnesium-simethicone  (FIRST MOUTHWASH BLM) oral suspension Swish and swallow  5 mLs every 4 (four) hours as needed (sore throat) (Patient not taking: Reported on 05/01/2024) 120 mL 1  . pantoprazole (PROTONIX) 40 MG DR tablet TAKE 1 TABLET BY MOUTH EVERY DAY (Patient not taking: Reported on 05/01/2024) 90 tablet 1  . traMADol (ULTRAM) 50 mg tablet 1/2-1 po bid prn (Patient not taking: Reported on 05/01/2024) 14 tablet 0   No facility-administered medications prior to visit.      Objective  Vitals:   05/01/24 1457  BP: (!) 152/104   Pulse: 84  Weight: 59 kg (130 lb)  Height: 165.1 cm (5' 5)  PainSc:   6  PainLoc: Foot   Body mass index is 21.63 kg/m.  Home Vitals:     Physical Exam Physical Exam EXTREMITIES:Adductovarus rotation deformity the right fifth toe causing compression to the lesion.  She has a listers corn at the lateral fifth toe.  Otherwise painful range of motion of the ankle subtalar midtarsal joints  Vascular: Palpable DP PT pulses  Neuro: Full sensation is noted  Derm: Listers corn as described above at the right fifth toe.  She also has thickening and dystrophy of the right great toenail with loosening of the nail and subungual debris.  She is complaining of pain associated with this.  The following labs were personally reviewed: - Lab Results  Component Value Date   WBC 11.2 (H) 07/22/2017   HGB 12.6 07/22/2017   HCT 38.1 07/22/2017   PLT 317 07/22/2017   - Lab Results  Component Value Date   NA 139 01/24/2018   K 4.6 01/24/2018   CL 100 01/24/2018   CO2 33.3 (H) 01/24/2018   BUN 20 01/24/2018   CREATININE 0.8 01/24/2018   GLUCOSE 89 01/24/2018   - Lab Results  Component Value Date   NA 139 01/24/2018   K 4.6 01/24/2018   CL 100 01/24/2018   CO2 33.3 (H) 01/24/2018   BUN 20 01/24/2018   CREATININE 0.8 01/24/2018   CALCIUM 9.7 01/24/2018   TP 7.5 09/17/2006   ALB 4.3 07/22/2017   TBILI 0.7 07/22/2017   ALKPHOS 60 07/22/2017   AST 16 07/22/2017   ALT 13 07/22/2017   GLUCOSE 89 01/24/2018   GFR 70 01/24/2018   -No results found for: HGBA1C    Results      Assessment/Plan:   Assessment & Plan Chronic ingrown toenail Chronic ingrown toenail on the lateral aspect of the left fifth toe for approximately three months. No associated erythema or drainage. Anatomical rotation of the toe contributes to condyle prominence, exacerbating the condition. - Consider partial nail avulsion with phenolization to prevent recurrence. - Educate on proper nail trimming  techniques and footwear to reduce pressure on the toe.  Broken toenail with onychomycosis Broken toenail on the right hallux, described as barely attached. No additional details on duration or symptoms. - I debrided the right great toenail today with sterile nail nippers.  She tolerated this well. - Advise on protective measures to prevent further trauma.  Diagnoses and all orders for this visit:  Hammer toe of right foot  Dermatophytosis of nail  Pain in toes of both feet          Future Appointments   This patient does not currently have any appointments scheduled.     There are no Patient Instructions on file for this visit.   This note has been created using automated tools and reviewed for accuracy by JUSTIN ALLEN FOWLER.

## 2024-05-03 NOTE — ED Notes (Signed)
 [facility] Post-Discharge Call  Multiple ED visits this month  Contacted patient at [phone]) - to discuss ED visit. Confirmed patient identity.   We want to ensure you understood your plan of care. Review AVS instructions. Did your discharge instructions answer all of your questions? yes Have you made a follow-up appointment? yes Have you filled your prescriptions (if applicable)? N/A Do you have any questions regarding your prescriptions?N/A Do you have any other questions? No Return precautions discussed.

## 2024-05-09 ENCOUNTER — Telehealth: Payer: Self-pay | Admitting: Cardiovascular Disease

## 2024-05-09 NOTE — Progress Notes (Unsigned)
 Cardiology Clinic Note   Date: 05/09/2024 ID: Sara Jackson, DOB 03-Feb-1941, MRN [id number]  Primary Cardiologist:  None  Chief Complaint   Sara Jackson is a 83 y.o. female who presents to the clinic today for ***  Patient Profile   Sara Jackson is followed by Dr. Gollan for the history outlined below.      Past medical history significant for: Dyspnea/Chest pain.  Echo 03/17/2022: EF 60 to 65%.  No RWMA.  Grade I DD.  Normal RV size/function.  Normal PA pressure, RVSP 35.3 mmHg.  Mild MR.  Aortic valve sclerosis without stenosis. Presyncope. 14-day ZIO 03/23/2022: HR 51 to 164 bpm, average 71 bpm.  5 runs of SVT/A. tach fastest 5 beats max rate 164 bpm, longest 10 beats average rate 113 bpm.  Rare ectopy.  No patient triggered events. Hypertension.  GERD.   In summary, patient was first evaluated by Dr. Gollan on 02/24/2022 for weakness, fatigue, shortness of breath, presyncope, chest discomfort.  She reported dizziness/near syncope episodes with no prodromal symptoms occurring once every 2 weeks.  She reported good hydration.  Orthostatics were negative at the time of her visit in SBP running between 150-170.  Echo demonstrated normal LV/RV function with Grade I DD and no significant valvular abnormalities.  14-day ZIO demonstrated 5 runs of SVT/A. tach as detailed above.  Patient presented to urgent care on 04/27/2024 with complaints of dizziness, headache, hypertension.  BP 183/95.  Patient was sent to ED for further evaluation.  Upon arrival to ED patient complained of dizziness and weakness.  She denied chest pain and shortness of breath.  Intake BP 168/74. Head CT demonstrated no acute intracranial abnormality, empty sella which could be normal anatomic variant or associated with idiopathic intracranial hypertension (pseudotumor cerebri).  Initial labs: WBC 8, hemoglobin 12.1, sodium 139, potassium 3.1, creatinine 1.03, BUN 24, magnesium 2.  Troponin 18>> 20.   Potassium repleted with p.o. potassium.  Positional vertigo was suspected and patient was started on meclizine  and instructed to follow-up with ENT.  Patient presented to [facility] ED on 04/30/2024 for dizziness.  Intake BP 147/66.  Orthostatic vitals were positive.  Labs were unremarkable.  It was recommended she increase hydration and she was discharged home.  Patient contacted the office with complaints of dizziness.  Per triage RN Pt called with complaints of becoming dizzy when standing; states that she sits a minute or two before standing, but still gets dizzy; she is on BP medication, llisinopril-hydrochlorothiazide 20-12.5 mg daily. States she takes her medication the same time every day. Pt reports on 6/30 her BP 190/95, today it was normal (125/76) but still experiencing dizziness; pt requested stated that her PCP suggested that she her cardiologist. States she called for an appointment to see Dr. Gollan, but was told it would be September. Pt agreeable to see one of our APPs; pt has an appointment scheduled for 7/10 with Barnie Hila, NP; pt thanked me for the appointment.     History of Present Illness    Today, patient ***  Dizziness/presyncope/hypertension Patient with 2 recent ED visits for dizziness.  BP today***orthostatic vitals*** -***  ROS: All other systems reviewed and are otherwise negative except as noted in History of Present Illness.  EKGs/Labs Reviewed        04/27/2024: BUN 24; Creatinine, Ser 1.03; Potassium 3.1; Sodium 139   04/27/2024: Hemoglobin 12.1; WBC 8.0   No results found for requested labs within last 365 days.   No results  found for requested labs within last 365 days.  ***  Risk Assessment/Calculations    {Does this patient have ATRIAL FIBRILLATION?[phone]} No BP recorded.  {Refresh Note OR Click here to enter BP  :1}***        Physical Exam    VS:  There were no vitals taken for this visit. , BMI There is no height or weight on file  to calculate BMI.  GEN: Well nourished, well developed, in no acute distress. Neck: No JVD or carotid bruits. Cardiac: *** RRR. *** No murmur. No rubs or gallops.   Respiratory:  Respirations regular and unlabored. Clear to auscultation without rales, wheezing or rhonchi. GI: Soft, nontender, nondistended. Extremities: Radials/DP/PT 2+ and equal bilaterally. No clubbing or cyanosis. No edema ***  Skin: Warm and dry, no rash. Neuro: Strength intact.  Assessment & Plan   ***  Disposition: ***     {Are you ordering a CV Procedure (e.g. stress test, cath, DCCV, TEE, etc)?   Press F2        :[id number]}   Signed, Barnie HERO. Delphin Funes, DNP, NP-C

## 2024-05-09 NOTE — Telephone Encounter (Signed)
 Pt called with complaints of becoming dizzy when standing; states that she sits a minute or two before standing, but still gets dizzy; she is on BP medication, llisinopril-hydrochlorothiazide 20-12.5 mg daily.  States she takes her medication the same time every day.  Pt reports on 6/30 her BP 190/95, today it was normal (125/76) but still experiencing dizziness; pt requested stated that her PCP suggested that she her cardiologist.  States she called for an appointment to see Dr. Gollan, but was told it would be September.  Pt agreeable to see one of our APPs; pt has an appointment scheduled for 7/10 with Barnie Hila, NP; pt thanked me for the appointment.

## 2024-05-09 NOTE — Telephone Encounter (Signed)
 STAT if patient feels like he/she is going to faint   Are you dizzy, lightheaded, or faint now?  No, only occurs when standing   Have you passed out?  No   Do you have any other symptoms?  Headaches   Have you checked your HR and BP (record if available)?   Patient says BP has been sporadic recently. She mentions that she has taken BP medication today and BP is normal.  190/95 highest 125/76 today  113/70's lowest

## 2024-05-11 ENCOUNTER — Encounter: Payer: Self-pay | Admitting: Student

## 2024-05-11 ENCOUNTER — Ambulatory Visit: Attending: Student | Admitting: Student

## 2024-05-11 VITALS — BP 102/67 | HR 91 | Ht 65.0 in | Wt 130.8 lb

## 2024-05-11 DIAGNOSIS — I10 Essential (primary) hypertension: Secondary | ICD-10-CM | POA: Insufficient documentation

## 2024-05-11 DIAGNOSIS — R42 Dizziness and giddiness: Secondary | ICD-10-CM | POA: Insufficient documentation

## 2024-05-11 DIAGNOSIS — I951 Orthostatic hypotension: Secondary | ICD-10-CM | POA: Diagnosis present

## 2024-05-11 DIAGNOSIS — R55 Syncope and collapse: Secondary | ICD-10-CM | POA: Diagnosis present

## 2024-05-11 NOTE — Patient Instructions (Addendum)
 Medication Instructions:   Your physician recommends the following medication changes.  STOP TAKING: Lisiniporil - Hydrochlorathiazide (ZESTROETIC)   Keep a log of your AM blood pressures for 1 week     *If you need a refill on your cardiac medications before your next appointment, please call your pharmacy*  Lab Work:  No labs ordered today  If you have labs (blood work) drawn today and your tests are completely normal, you will receive your results only by: MyChart Message (if you have MyChart) OR A paper copy in the mail If you have any lab test that is abnormal or we need to change your treatment, we will call you to review the results.  Testing/Procedures:  Wants next available appointment; pt states will go to [facility], but willing  any facility in [facility]  Your physician has requested that you have a carotid duplex. This test is an ultrasound of the carotid arteries in your neck. It looks at blood flow through these arteries that supply the brain with blood.   Allow one hour for this exam.  There are no restrictions or special instructions.  [facility] [place] : [address], [place] [address]  Please note: We ask at that you not bring children with you during ultrasound (echo/ vascular) testing. Due to room size and safety concerns, children are not allowed in the ultrasound rooms during exams. Our front office staff cannot provide observation of children in our lobby area while testing is being conducted. An adult accompanying a patient to their appointment will only be allowed in the ultrasound room at the discretion of the ultrasound technician under special circumstances. We apologize for any inconvenience.   Follow-Up: At [facility], you and your health needs are our priority.  As part of our continuing mission to provide you with exceptional heart care, our providers are all part of one team.  This team includes your primary  Cardiologist (physician) and Advanced Practice Providers or APPs (Physician Assistants and Nurse Practitioners) who all work together to provide you with the care you need, when you need it.  Your next appointment:   1  week(s)   (Wednesday or Thursday) Provider:    Tylene Goodell

## 2024-05-17 ENCOUNTER — Ambulatory Visit: Admitting: Cardiology

## 2024-05-23 ENCOUNTER — Ambulatory Visit: Admitting: Physician Assistant

## 2024-05-23 ENCOUNTER — Ambulatory Visit: Payer: Self-pay | Admitting: Physician Assistant

## 2024-05-23 ENCOUNTER — Ambulatory Visit (HOSPITAL_COMMUNITY)
Admission: RE | Admit: 2024-05-23 | Discharge: 2024-05-23 | Disposition: A | Discharge status: Home / Self Care | Source: Ambulatory Visit | Attending: Student | Admitting: Student

## 2024-05-23 DIAGNOSIS — I10 Essential (primary) hypertension: Secondary | ICD-10-CM | POA: Diagnosis present

## 2024-05-23 DIAGNOSIS — R42 Dizziness and giddiness: Secondary | ICD-10-CM | POA: Insufficient documentation

## 2024-05-25 NOTE — Progress Notes (Signed)
 Last read by Blima LABOR Frith at 5:15PM on 05/24/2024.

## 2024-05-26 NOTE — Progress Notes (Deleted)
 Cardiology Office Note    Date:  05/26/2024   ID:  Sara Jackson, DOB 01-10-1941, MRN [id number]  PCP:  Pcp, No  Cardiologist:  Evalene Lunger, MD  Electrophysiologist:  None   Chief Complaint: Follow-up  History of Present Illness:   Sara Jackson is a 83 y.o. female with history of ***  ***   Labs independently reviewed: 04/2024 - potassium 4.2, BUN 13, serum creatinine 0.93, albumin 3.7, AST/ALT normal, Hgb 12.8, PLT 256, magnesium 2.0 11/2023 - TC 284, TG 157, HDL 79, LDL 177, TSH normal  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   Vaginal atrophy     Past Surgical History:  Procedure Laterality Date   BIOPSY  07/28/2023   Procedure: BIOPSY;  Surgeon: Unk Corinn Skiff, MD;  Location: [facility] ENDOSCOPY;  Service: Gastroenterology;;   BREAST BIOPSY Left    CATARACT EXTRACTION W/PHACO Right 08/03/2022   Procedure: CATARACT EXTRACTION PHACO AND INTRAOCULAR LENS PLACEMENT (IOC) RIGHT CLARION VIVITY TORIC  LENS  4.49 00:31.4;  Surgeon: Myrna Adine Anes, MD;  Location: [facility];  Service: Ophthalmology;  Laterality: Right;   CATARACT EXTRACTION W/PHACO Left 08/17/2022   Procedure: CATARACT EXTRACTION PHACO AND INTRAOCULAR LENS PLACEMENT (IOC) LEFT CLARION VIVITY TORIC LENS HYDRUS MICROSTENT;  Surgeon: Myrna Adine Anes, MD;  Location: [facility];  Service: Ophthalmology;  Laterality: Left;  4.49 0:37.0    ESOPHAGOGASTRODUODENOSCOPY (EGD) WITH PROPOFOL  N/A 03/12/2016   Procedure: ESOPHAGOGASTRODUODENOSCOPY (EGD) WITH PROPOFOL  with dilation;  Surgeon: Rogelia Copping, MD;  Location: [facility];  Service: Endoscopy;  Laterality: N/A;   ESOPHAGOGASTRODUODENOSCOPY (EGD) WITH PROPOFOL  N/A 07/28/2023   Procedure: ESOPHAGOGASTRODUODENOSCOPY (EGD) WITH PROPOFOL ;  Surgeon: Unk Corinn Skiff, MD;  Location: [facility] ENDOSCOPY;  Service: Gastroenterology;  Laterality: N/A;   MELANOMA EXCISION     RADICAL HYSTERECTOMY      Current Medications: No outpatient medications have been marked as taking for the 05/29/24 encounter (Appointment) with Abigail Bernardino HERO, PA-C.    Allergies:   Azo [phenazopyridine], Colesevelam, Colesevelam hcl, Doxycycline hyclate, Ezetimibe, Fluoxetine, Other, Statins, Ace inhibitors, Doxycycline, Nsaids, and Risedronate sodium   Social History   Socioeconomic History   Marital status: Widowed    Spouse name: Not on file   Number of children: Not on file   Years of education: Not on file   Highest education level: Not on file  Occupational History   Not on file  Tobacco Use   Smoking status: Never   Smokeless tobacco: Never  Vaping Use   Vaping status: Never Used  Substance and Sexual Activity   Alcohol use: No    Alcohol/week: 0.0 standard drinks of alcohol   Drug use: No   Sexual activity: Not on file  Other Topics Concern   Not on file  Social History Narrative   Not on file   Social Drivers of Health   Financial Resource Strain: Patient Declined (04/04/2024)   Received [facility]   Overall Financial Resource Strain (CARDIA)    Difficulty of Paying Living Expenses: Patient declined  Food Insecurity: Patient Declined (04/04/2024)   Received from [facility]   Hunger Vital Sign    Within the past 12 months, you worried that your food would run out before you got the money to buy more.: Patient declined    Within the past 12  months, the food you bought just didn't last and you didn't have money to get more.: Patient declined  Transportation Needs: Patient Declined (04/04/2024)   Received from [facility]    In the past 12 months, has lack of transportation kept you from medical appointments or from getting medications?: Patient declined    Lack of Transportation (Non-Medical): Patient declined  Physical Activity: Not on file  Stress: Not on file  Social Connections: Not on file     Family History:  The patient's family history includes COPD in her half-brother; Cancer in her mother; Cerebral aneurysm in her father; Dementia in her half-sister. There is no history of Kidney cancer or Bladder Cancer.  ROS:   12-point review of systems is negative unless otherwise noted in the HPI.   EKGs/Labs/Other Studies Reviewed:    Studies reviewed were summarized above. The additional studies were reviewed today:  Carotid artery ultrasound 05/23/2024: Summary:  Right Carotid: Velocities in the right ICA are consistent with a 1-39%  stenosis.  The extracranial vessels were near-normal with only minimal  wall thickening or plaque.   Left Carotid: Velocities in the left ICA are consistent with a 1-39%  stenosis.  The extracranial vessels were near-normal with only minimal  wall thickening or plaque.   Vertebrals:  Bilateral vertebral arteries demonstrate antegrade flow.  Subclavians: Normal flow hemodynamics were seen in bilateral subclavian arteries.  __________  2D echo 03/17/2022: 1. Left ventricular ejection fraction, by estimation, is 60 to 65%. The  left ventricle has normal function. The left ventricle has no regional  wall motion abnormalities. Left ventricular diastolic parameters are  consistent with Grade I diastolic  dysfunction (impaired relaxation).   2. Right ventricular systolic function is normal. The right ventricular  size is normal. There is normal  pulmonary artery systolic pressure. The  estimated right ventricular systolic pressure is 35.3 mmHg.   3. The mitral valve is normal in structure. Mild mitral valve  regurgitation. No evidence of mitral stenosis.   4. The aortic valve is normal in structure. Aortic valve regurgitation is  not visualized. Aortic valve sclerosis is present, with no evidence of  aortic valve stenosis.   5. The inferior vena cava is normal in size with greater than 50%  respiratory variability, suggesting right atrial pressure of 3 mmHg.  __________  Zio patch 01/2022: Normal sinus rhythm Patient had a min HR of 51 bpm, max HR of 164 bpm, and avg HR of 71 bpm.    5 Supraventricular Tachycardia/atrial tachycardia runs occurred, the run with the fastest interval lasting 5 beats with a max rate of 164 bpm, the longest lasting 10 beats with an avg rate of 113 bpm.    Isolated SVEs were rare (<1.0%), SVE Couplets were rare (<1.0%), and SVE Triplets were rare (<1.0%). Isolated VEs were rare (<1.0%), VE Couplets were rare (<1.0%), and no VE Triplets were present.   No patient triggered events recorded   EKG:  EKG is ordered today.  The EKG ordered today demonstrates ***  Recent  Labs: 04/27/2024: BUN 24; Creatinine, Ser 1.03; Hemoglobin 12.1; Magnesium 2.0; Platelets 293; Potassium 3.1; Sodium 139  Recent Lipid Panel No results found for: CHOL, TRIG, HDL, CHOLHDL, VLDL, LDLCALC, LDLDIRECT  PHYSICAL EXAM:    VS:  There were no vitals taken for this visit.  BMI: There is no height or weight on file to calculate BMI.  Physical Exam  Wt Readings from Last 3 Encounters:  05/11/24 130 lb 12.8 oz (59.3 kg)  04/27/24 130 lb (59 kg)  04/17/24 132 lb (59.9 kg)     ASSESSMENT & PLAN:   ***   {Are you ordering a CV Procedure (e.g. stress test, cath, DCCV, TEE, etc)?   Press F2        :[id number]}     Disposition: F/u with Dr. Gollan or an APP in ***.   Medication Adjustments/Labs and Tests  Ordered: Current medicines are reviewed at length with the patient today.  Concerns regarding medicines are outlined above. Medication changes, Labs and Tests ordered today are summarized above and listed in the Patient Instructions accessible in Encounters.   Signed, Bernardino Bring, PA-C 05/26/2024 7:56 AM     [facility] [address] [place], [place] [address] [phone]

## 2024-05-28 ENCOUNTER — Emergency Department (HOSPITAL_COMMUNITY)

## 2024-05-28 ENCOUNTER — Inpatient Hospital Stay (HOSPITAL_COMMUNITY)
Admission: EM | Admit: 2024-05-28 | Discharge: 2024-05-30 | DRG: 062 | Disposition: A | Discharge status: Home / Self Care | Attending: Neurology | Admitting: Neurology

## 2024-05-28 ENCOUNTER — Encounter (HOSPITAL_COMMUNITY): Payer: Self-pay

## 2024-05-28 ENCOUNTER — Other Ambulatory Visit: Payer: Self-pay

## 2024-05-28 DIAGNOSIS — R636 Underweight: Secondary | ICD-10-CM | POA: Diagnosis present

## 2024-05-28 DIAGNOSIS — I10 Essential (primary) hypertension: Secondary | ICD-10-CM | POA: Diagnosis present

## 2024-05-28 DIAGNOSIS — Z823 Family history of stroke: Secondary | ICD-10-CM

## 2024-05-28 DIAGNOSIS — E7800 Pure hypercholesterolemia, unspecified: Secondary | ICD-10-CM | POA: Diagnosis present

## 2024-05-28 DIAGNOSIS — R4701 Aphasia: Secondary | ICD-10-CM | POA: Diagnosis present

## 2024-05-28 DIAGNOSIS — Z7982 Long term (current) use of aspirin: Secondary | ICD-10-CM

## 2024-05-28 DIAGNOSIS — Z881 Allergy status to other antibiotic agents status: Secondary | ICD-10-CM | POA: Diagnosis not present

## 2024-05-28 DIAGNOSIS — Z6822 Body mass index (BMI) 22.0-22.9, adult: Secondary | ICD-10-CM

## 2024-05-28 DIAGNOSIS — G459 Transient cerebral ischemic attack, unspecified: Secondary | ICD-10-CM | POA: Diagnosis not present

## 2024-05-28 DIAGNOSIS — I161 Hypertensive emergency: Secondary | ICD-10-CM | POA: Diagnosis present

## 2024-05-28 DIAGNOSIS — G8194 Hemiplegia, unspecified affecting left nondominant side: Secondary | ICD-10-CM | POA: Diagnosis present

## 2024-05-28 DIAGNOSIS — Z886 Allergy status to analgesic agent status: Secondary | ICD-10-CM | POA: Diagnosis not present

## 2024-05-28 DIAGNOSIS — I639 Cerebral infarction, unspecified: Principal | ICD-10-CM | POA: Diagnosis present

## 2024-05-28 DIAGNOSIS — R29700 NIHSS score 0: Secondary | ICD-10-CM | POA: Diagnosis not present

## 2024-05-28 DIAGNOSIS — R29711 NIHSS score 11: Secondary | ICD-10-CM | POA: Diagnosis present

## 2024-05-28 DIAGNOSIS — Z7902 Long term (current) use of antithrombotics/antiplatelets: Secondary | ICD-10-CM | POA: Diagnosis not present

## 2024-05-28 DIAGNOSIS — Z825 Family history of asthma and other chronic lower respiratory diseases: Secondary | ICD-10-CM

## 2024-05-28 DIAGNOSIS — R29704 NIHSS score 4: Secondary | ICD-10-CM

## 2024-05-28 DIAGNOSIS — Z820 Family history of epilepsy and other diseases of the nervous system: Secondary | ICD-10-CM

## 2024-05-28 DIAGNOSIS — Z888 Allergy status to other drugs, medicaments and biological substances status: Secondary | ICD-10-CM | POA: Diagnosis not present

## 2024-05-28 DIAGNOSIS — R519 Headache, unspecified: Secondary | ICD-10-CM

## 2024-05-28 DIAGNOSIS — R471 Dysarthria and anarthria: Secondary | ICD-10-CM | POA: Diagnosis present

## 2024-05-28 DIAGNOSIS — Z79899 Other long term (current) drug therapy: Secondary | ICD-10-CM

## 2024-05-28 DIAGNOSIS — Z809 Family history of malignant neoplasm, unspecified: Secondary | ICD-10-CM

## 2024-05-28 DIAGNOSIS — Z85820 Personal history of malignant melanoma of skin: Secondary | ICD-10-CM | POA: Diagnosis not present

## 2024-05-28 DIAGNOSIS — H409 Unspecified glaucoma: Secondary | ICD-10-CM | POA: Diagnosis present

## 2024-05-28 DIAGNOSIS — I959 Hypotension, unspecified: Secondary | ICD-10-CM | POA: Diagnosis present

## 2024-05-28 DIAGNOSIS — R2990 Unspecified symptoms and signs involving the nervous system: Principal | ICD-10-CM

## 2024-05-28 DIAGNOSIS — K219 Gastro-esophageal reflux disease without esophagitis: Secondary | ICD-10-CM | POA: Diagnosis present

## 2024-05-28 LAB — URINALYSIS, W/ REFLEX TO CULTURE (INFECTION SUSPECTED)
Bacteria, UA: NONE SEEN
Bilirubin Urine: NEGATIVE
Glucose, UA: NEGATIVE mg/dL
Hgb urine dipstick: NEGATIVE
Ketones, ur: NEGATIVE mg/dL
Leukocytes,Ua: NEGATIVE
Nitrite: NEGATIVE
Protein, ur: NEGATIVE mg/dL
Specific Gravity, Urine: 1.034 — ABNORMAL HIGH (ref 1.005–1.030)
pH: 7 (ref 5.0–8.0)

## 2024-05-28 LAB — CBG MONITORING, ED: Glucose-Capillary: 114 mg/dL — ABNORMAL HIGH (ref 70–99)

## 2024-05-28 LAB — I-STAT CHEM 8, ED
BUN: 16 mg/dL (ref 8–23)
Calcium, Ion: 1.17 mmol/L (ref 1.15–1.40)
Chloride: 100 mmol/L (ref 98–111)
Creatinine, Ser: 1 mg/dL (ref 0.44–1.00)
Glucose, Bld: 108 mg/dL — ABNORMAL HIGH (ref 70–99)
HCT: 40 % (ref 36.0–46.0)
Hemoglobin: 13.6 g/dL (ref 12.0–15.0)
Potassium: 3.4 mmol/L — ABNORMAL LOW (ref 3.5–5.1)
Sodium: 139 mmol/L (ref 135–145)
TCO2: 27 mmol/L (ref 22–32)

## 2024-05-28 LAB — DIFFERENTIAL
Abs Immature Granulocytes: 0.01 K/uL (ref 0.00–0.07)
Basophils Absolute: 0.1 K/uL (ref 0.0–0.1)
Basophils Relative: 1 %
Eosinophils Absolute: 0.1 K/uL (ref 0.0–0.5)
Eosinophils Relative: 1 %
Immature Granulocytes: 0 %
Lymphocytes Relative: 36 %
Lymphs Abs: 3 K/uL (ref 0.7–4.0)
Monocytes Absolute: 0.5 K/uL (ref 0.1–1.0)
Monocytes Relative: 6 %
Neutro Abs: 4.6 K/uL (ref 1.7–7.7)
Neutrophils Relative %: 56 %

## 2024-05-28 LAB — COMPREHENSIVE METABOLIC PANEL WITH GFR
ALT: 23 U/L (ref 0–44)
AST: 33 U/L (ref 15–41)
Albumin: 4 g/dL (ref 3.5–5.0)
Alkaline Phosphatase: 66 U/L (ref 38–126)
Anion gap: 11 (ref 5–15)
BUN: 12 mg/dL (ref 8–23)
CO2: 27 mmol/L (ref 22–32)
Calcium: 9.3 mg/dL (ref 8.9–10.3)
Chloride: 100 mmol/L (ref 98–111)
Creatinine, Ser: 0.98 mg/dL (ref 0.44–1.00)
GFR, Estimated: 57 mL/min — ABNORMAL LOW (ref >60)
Glucose, Bld: 110 mg/dL — ABNORMAL HIGH (ref 70–99)
Potassium: 3.5 mmol/L (ref 3.5–5.1)
Sodium: 138 mmol/L (ref 135–145)
Total Bilirubin: 0.6 mg/dL (ref 0.0–1.2)
Total Protein: 7.7 g/dL (ref 6.5–8.1)

## 2024-05-28 LAB — APTT: aPTT: 26 s (ref 24–36)

## 2024-05-28 LAB — PROTIME-INR
INR: 0.9 (ref 0.8–1.2)
Prothrombin Time: 12.3 s (ref 11.4–15.2)

## 2024-05-28 LAB — MRSA NEXT GEN BY PCR, NASAL: MRSA by PCR Next Gen: NOT DETECTED

## 2024-05-28 LAB — CBC
HCT: 39.1 % (ref 36.0–46.0)
Hemoglobin: 12.7 g/dL (ref 12.0–15.0)
MCH: 30.5 pg (ref 26.0–34.0)
MCHC: 32.5 g/dL (ref 30.0–36.0)
MCV: 93.8 fL (ref 80.0–100.0)
Platelets: 316 K/uL (ref 150–400)
RBC: 4.17 MIL/uL (ref 3.87–5.11)
RDW: 13.1 % (ref 11.5–15.5)
WBC: 8.4 K/uL (ref 4.0–10.5)
nRBC: 0 % (ref 0.0–0.2)

## 2024-05-28 LAB — HEMOGLOBIN A1C
Hgb A1c MFr Bld: 4.8 % (ref 4.8–5.6)
Mean Plasma Glucose: 91.06 mg/dL

## 2024-05-28 LAB — ETHANOL: Alcohol, Ethyl (B): <15 mg/dL (ref <15)

## 2024-05-28 MED ORDER — IOHEXOL 350 MG/ML SOLN
75.0000 mL | Freq: Once | INTRAVENOUS | Status: AC | PRN
  Administered 2024-05-28: 75 mL via INTRAVENOUS

## 2024-05-28 MED ORDER — ACETAMINOPHEN 160 MG/5ML PO SOLN: 650.0000 mg | ORAL | Status: DC | PRN

## 2024-05-28 MED ORDER — LATANOPROST 0.005 % OP SOLN
1.0000 [drp] | Freq: Every day | OPHTHALMIC | Status: DC
  Administered 2024-05-28 – 2024-05-29 (×2): 1 [drp] via OPHTHALMIC
  Filled 2024-05-28: qty 2.5

## 2024-05-28 MED ORDER — ORAL CARE MOUTH RINSE: 15.0000 mL | OROMUCOSAL | Status: DC | PRN

## 2024-05-28 MED ORDER — ACETAMINOPHEN 325 MG PO TABS
650.0000 mg | ORAL_TABLET | ORAL | Status: DC | PRN
  Administered 2024-05-28: 650 mg via ORAL
  Filled 2024-05-28: qty 2

## 2024-05-28 MED ORDER — STROKE: EARLY STAGES OF RECOVERY BOOK
Freq: Once | Status: AC
  Filled 2024-05-28: qty 1

## 2024-05-28 MED ORDER — VITAMIN D 25 MCG (1000 UNIT) PO TABS
2000.0000 [IU] | ORAL_TABLET | Freq: Every day | ORAL | Status: DC
  Administered 2024-05-29 – 2024-05-30 (×2): 2000 [IU] via ORAL
  Filled 2024-05-28 (×2): qty 2

## 2024-05-28 MED ORDER — SODIUM CHLORIDE 0.9 % IV SOLN: INTRAVENOUS | Status: AC

## 2024-05-28 MED ORDER — SODIUM CHLORIDE 0.9% FLUSH
3.0000 mL | Freq: Once | INTRAVENOUS | Status: AC
  Administered 2024-05-28: 3 mL via INTRAVENOUS

## 2024-05-28 MED ORDER — CLEVIDIPINE BUTYRATE 0.5 MG/ML IV EMUL
0.0000 mg/h | INTRAVENOUS | Status: DC
  Administered 2024-05-28: 2 mg/h via INTRAVENOUS
  Filled 2024-05-28: qty 100

## 2024-05-28 MED ORDER — CHLORHEXIDINE GLUCONATE CLOTH 2 % EX PADS
6.0000 | MEDICATED_PAD | Freq: Every day | CUTANEOUS | Status: DC
  Administered 2024-05-28 – 2024-05-29 (×2): 6 via TOPICAL

## 2024-05-28 MED ORDER — CALCIUM CARBONATE ANTACID 500 MG PO CHEW: 1.0000 | CHEWABLE_TABLET | Freq: Two times a day (BID) | ORAL | Status: DC | PRN

## 2024-05-28 MED ORDER — ADULT MULTIVITAMIN W/MINERALS CH
1.0000 | ORAL_TABLET | Freq: Every day | ORAL | Status: DC
  Administered 2024-05-29 – 2024-05-30 (×2): 1 via ORAL
  Filled 2024-05-28 (×2): qty 1

## 2024-05-28 MED ORDER — PROCHLORPERAZINE EDISYLATE 10 MG/2ML IJ SOLN
10.0000 mg | Freq: Once | INTRAMUSCULAR | Status: AC
  Administered 2024-05-28: 10 mg via INTRAVENOUS

## 2024-05-28 MED ORDER — ACETAMINOPHEN 650 MG RE SUPP: 650.0000 mg | RECTAL | Status: DC | PRN

## 2024-05-28 MED ORDER — SENNOSIDES-DOCUSATE SODIUM 8.6-50 MG PO TABS: 1.0000 | ORAL_TABLET | Freq: Every evening | ORAL | Status: DC | PRN

## 2024-05-28 MED ORDER — PANTOPRAZOLE SODIUM 40 MG IV SOLR: 40.0000 mg | Freq: Every day | INTRAVENOUS | Status: DC

## 2024-05-28 MED ORDER — TENECTEPLASE FOR STROKE
0.2500 mg/kg | PACK | Freq: Once | INTRAVENOUS | Status: AC
  Administered 2024-05-28: 15 mg via INTRAVENOUS
  Filled 2024-05-28: qty 10

## 2024-05-28 NOTE — Code Documentation (Signed)
 Stroke Response Nurse Documentation Code Documentation  Sara Jackson is a 83 y.o. female arriving to [place]  via [facility] on 05-28-24 with past medical hx of HTN, . On No antithrombotic. Code stroke was activated by EMS.   Patient from home where she was LKW at 1600 and now complaining of headache dizziness difficulty speaking   Stroke team at the bedside on patient arrival. Labs drawn and patient cleared for CT by EDP. Patient to CT with team. NIHSS 11 with improvement to NIHSS 4, see documentation for details and code stroke times. Patient with decreased LOC, disoriented, not following commands, left facial droop, left arm weakness, left leg weakness, Global aphasia , dysarthria , and left neglect on exam. The following imaging was completed:  CT Head and CTA. Patient was given TNK at 1812 after verbal consent from daughter. Patient is not a candidate for IR due to no LVO on CTA.   Care Plan: VS & NIHSS q 15 min x 2 hours then q x 6 hours then q 1 hours x 16 hours..   Process Delays Noted: family consent  Bedside handoff with ED RN Caron.    Elvin Portland  Stroke Response RN

## 2024-05-28 NOTE — Progress Notes (Signed)
 PHARMACIST CODE STROKE RESPONSE  Notified to mix TNK at 1808 by Dr. Merrianne TNK preparation completed at 1809  TNK dose = 15 mg IV over 5 seconds.   Issues/delays encountered (if applicable): Consent from daughter   Sara Jackson 05/28/24 6:13 PM

## 2024-05-28 NOTE — ED Notes (Signed)
MD notified of BP

## 2024-05-28 NOTE — H&P (Addendum)
 NEUROLOGY ADMISSION HISTORY AND PHYSICAL   Date of service: May 28, 2024 Patient Name: Sara Jackson MRN:  [id number] DOB:  02-28-1941 Chief Complaint: Aphasia, headache and left sided weakness Requesting Provider: No att. providers found  History of Present Illness  Sara Jackson is a 83 y.o. left-handed female with a PMHx of hypertension, chronic diarrhea, BPPV with multiple recent ED presentations for dizziness, hyperlipidemia and GERD who presents with headache, aphasia and left sided weakness.  She was recently taken off of her antihypertensives by her PCP. Patient was in her usual state of health today until 1600, when she began to complain of a headache, developed an unsteady gait and became aphasic.  Her daughter called EMS, and she was found to be quite hypertensive with BP 230/110, HR 97, CBG 106. EMS assessment revealed left sided weakness and aphasia.   The patient also has recently been having recurrent episodes of lightheadedness when standing. She takes her BP several times a day and states that her BP drops and her pulse increases when standing up, with associated lightheadedness.   LKW: 1611 Modified rankin score: 0 IV Thrombolysis: Yes EVT: No: CTA negative for LVO  Exam before CT head: NIHSS components Score: Comment  1a Level of Conscious 0[]  1[x]  2[]  3[]      1b LOC Questions 0[]  1[]  2[x]        1c LOC Commands 0[]  1[x]  2[]       2 Best Gaze 0[x]  1[]  2[]       3 Visual 0[x]  1[]  2[]  3[]      4 Facial Palsy 0[]  1[x]  2[]  3[]     Decreased left NL fold  5a Motor Arm - left 0[]  1[x]  2[]  3[]  4[]  UN[]    5b Motor Arm - Right 0[x]  1[]  2[]  3[]  4[]  UN[]    6a Motor Leg - Left 0[]  1[x]  2[]  3[]  4[]  UN[]    6b Motor Leg - Right 0[x]  1[]  2[]  3[]  4[]  UN[]    7 Limb Ataxia 0[x]  1[]  2[]  UN[]      8 Sensory 0[x]  1[]  2[]  UN[]      9 Best Language 0[]  1[]  2[x]  3[]      10 Dysarthria 0[]  1[x]  2[]  UN[]      11 Extinct. and Inattention 0[]  1[x]  2[]       TOTAL:   11      Exam after  CT head: NIHSS components Score: Comment  1a Level of Conscious 0[]  1[x]  2[]  3[]      1b LOC Questions 0[x]  1[]  2[]        1c LOC Commands 0[x]  1[]  2[]       2 Best Gaze 0[x]  1[]  2[]       3 Visual 0[x]  1[]  2[]  3[]      4 Facial Palsy 0[]  1[x]  2[]  3[]     Decreased left NL fold  5a Motor Arm - left 0[x]  1[]  2[]  3[]  4[]  UN[]    5b Motor Arm - Right 0[x]  1[]  2[]  3[]  4[]  UN[]    6a Motor Leg - Left 0[x]  1[]  2[]  3[]  4[]  UN[]    6b Motor Leg - Right 0[x]  1[]  2[]  3[]  4[]  UN[]    7 Limb Ataxia 0[x]  1[]  2[]  UN[]      8 Sensory 0[x]  1[]  2[]  UN[]      9 Best Language 0[]  1[x]  2[]  3[]      10 Dysarthria 0[]  1[x]  2[]  UN[]      11 Extinct. and Inattention 0[x]  1[]  2[]       TOTAL:   4      ROS   Unable to ascertain  due to aphasia  Past History   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   Vaginal atrophy     Past Surgical History:  Procedure Laterality Date   BIOPSY  07/28/2023   Procedure: BIOPSY;  Surgeon: Unk Corinn Skiff, MD;  Location: [facility] END[facility]COPY;  Service: Gastroenterology;;   BREAST BIOPSY Left    CATARACT EXTRACTION W/PHACO Right 08/03/2022   Procedure: CATARACT EXTRACTION PHACO AND INTRAOCULAR LENS PLACEMENT (IOC) RIGHT CLARION VIVITY TORIC  LENS  4.49 00:31.4;  Surgeon: Myrna Adine Anes, MD;  Location: [facility];  Service: Ophthalmology;  Laterality: Right;   CATARACT EXTRACTION W/PHACO Left 08/17/2022   Procedure: CATARACT EXTRACTION PHACO AND INTRAOCULAR  LENS PLACEMENT (IOC) LEFT CLARION VIVITY TORIC LENS HYDRUS MICROSTENT;  Surgeon: Myrna Adine Anes, MD;  Location: [facility];  Service: Ophthalmology;  Laterality: Left;  4.49 0:37.0   ESOPHAGOGASTRODUODENOSCOPY (EGD) WITH PROPOFOL  N/A 03/12/2016   Procedure: ESOPHAGOGASTRODUODENOSCOPY (EGD) WITH PROPOFOL  with dilation;  Surgeon: Rogelia Copping, MD;  Location: [facility];  Service: Endoscopy;  Laterality: N/A;   ESOPHAGOGASTRODUODENOSCOPY (EGD) WITH PROPOFOL  N/A 07/28/2023   Procedure: ESOPHAGOGASTRODUODENOSCOPY (EGD) WITH PROPOFOL ;  Surgeon: Unk Corinn Skiff, MD;  Location: [facility] ENDOSCOPY;  Service: Gastroenterology;  Laterality: N/A;   MELANOMA EXCISION     RADICAL HYSTERECTOMY      Family History: Family History  Problem Relation Age of Onset   Cancer Mother    Cerebral aneurysm Father    COPD Half-Brother    Dementia Half-Sister    Kidney cancer Neg Hx    Bladder Cancer Neg Hx     Social History  reports that she has never smoked. She has never used smokeless tobacco. She reports that she does not drink alcohol and does not use drugs.  Allergies  Allergen Reactions   Azo [Phenazopyridine] Itching   Colesevelam Diarrhea   Colesevelam Hcl Diarrhea   Doxycycline Hyclate Other (See Comments)    Other Reaction: OTHER REACTION   Ezetimibe Diarrhea   Fluoxetine Other (See Comments)    Other Reaction: INSOMNIA   Other Other (See Comments)    Other Reaction: GI UPSET   Statins Diarrhea   Ace Inhibitors Rash    Other Reaction: INSOMNIA   Doxycycline Rash    Other Reaction: OTHER REACTION     Nsaids Rash    Other Reaction: GI UPSET   Risedronate Sodium Rash    Other Reaction: OTHER REACTION      Medications   Current Facility-Administered Medications:    sodium chloride  flush (NS) 0.9 % injection 3 mL, 3 mL, Intravenous, Once, Belfi, Melanie, MD  Current Outpatient Medications:    acetaminophen  (TYLENOL ) 325 MG tablet, Take by mouth., Disp: , Rfl:     calcium  carbonate (CALCIUM  600) 600 MG TABS tablet, Take 600 mg by mouth daily with breakfast., Disp: , Rfl:    calcium  carbonate (TUMS - DOSED IN MG ELEMENTAL CALCIUM ) 500 MG chewable tablet, Chew 1 tablet  by mouth 3 times/day as needed-between meals & bedtime for indigestion or heartburn., Disp: , Rfl:    Cholecalciferol  (VITAMIN D3) 50 MCG (2000 UT) capsule, Take 2,000 Units by mouth daily., Disp: , Rfl:    estradiol (ESTRACE) 1 MG tablet, Take 0.5 mg by mouth. , Disp: , Rfl:    latanoprost  (XALATAN ) 0.005 % ophthalmic solution, 1 drop at bedtime., Disp: , Rfl:    meclizine  (ANTIVERT ) 25 MG tablet, Take 1 tablet (25 mg total) by mouth 3 (three) times daily as needed. (Patient not taking: Reported on 05/11/2024), Disp: 30 tablet, Rfl: 0   Multiple Vitamins-Minerals (CENTRUM SILVER  PO), Take by mouth daily., Disp: , Rfl:    pantoprazole  (PROTONIX ) 40 MG tablet, Take 40 mg by mouth 2 (two) times daily. (Patient taking differently: Take 40 mg by mouth daily.), Disp: , Rfl:   Vitals   BP (!) 165/71 (BP Location: Right Arm)   Pulse (!) 101   Resp 18   Wt 61 kg   SpO2 99%   BMI 22.38 kg/m    Physical Exam   Constitutional: Appears well-developed and well-nourished.  Psych: Flattened affect Eyes: No scleral injection.  HENT: No OP obstruction.  Head: /AT Respiratory: Effort normal, non-labored breathing.   Neurologic Examination   See NIHSS  Labs/Imaging/Neurodiagnostic studies   CBC: No results for input(s): WBC, NEUTROABS, HGB, HCT, MCV, PLT in the last 168 hours. Basic Metabolic Panel:  Lab Results  Component Value Date   NA 139 04/27/2024   K 3.1 (L) 04/27/2024   CO2 30 04/27/2024   GLUCOSE 121 (H) 04/27/2024   BUN 24 (H) 04/27/2024   CREATININE 1.03 (H) 04/27/2024   CALCIUM  9.3 04/27/2024   GFRNONAA 54 (L) 04/27/2024   Prior carotid ultrasound (05/23/24) - Velocities in the right ICA are consistent with a 1-39%  - Left Carotid: Velocities in the left ICA  are consistent with a 1-39%  stenosis.  - The extracranial vessels were near-normal with only minimal  wall thickening or plaque.  - Vertebrals:  Bilateral vertebral arteries demonstrate antegrade flow.  - Subclavians: Normal flow hemodynamics were seen in bilateral subclavian arteries.   Prior TTE (03/17/22): 1. Left ventricular ejection fraction, by estimation, is 60 to 65%. The  left ventricle has normal function. The left ventricle has no regional  wall motion abnormalities. Left ventricular diastolic parameters are  consistent with Grade I diastolic dysfunction (impaired relaxation).   2. Right ventricular systolic function is normal. The right ventricular  size is normal. There is normal pulmonary artery systolic pressure. The  estimated right ventricular systolic pressure is 35.3 mmHg.   3. The mitral valve is normal in structure. Mild mitral valve  regurgitation. No evidence of mitral stenosis.   4. The aortic valve is normal in structure. Aortic valve regurgitation is  not visualized. Aortic valve sclerosis is present, with no evidence of  aortic valve stenosis.   5. The inferior vena cava is normal in size with greater than 50%  respiratory variability, suggesting right atrial pressure of 3 mmHg.    ASSESSMENT  83 y.o. female with hx of hypertension, chronic diarrhea, BPPV with multiple recent ED presentations for dizziness, hyperlipidemia and GERD who presents with headache, aphasia and left sided weakness.  She was recently taken off of her antihypertensives by her PCP. Patient was in her usual state of health today until 1600, when she began to complain of a headache, developed an unsteady gait and became aphasic.  Her daughter called EMS,  and she was found to be quite hypertensive with SBP in the 200s. - Exam prior to CT with NIHSS 11. After CT and prior to TNK, her NIHSS is 4, with continued aphasia and dysarthria. - CT head: No evidence of acute intracranial abnormality.  ASPECTS of 10.  - CTA of head and neck: No large vessel occlusion. 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 Cervical carotid atherosclerosis without significant stenosis. Aortic atherosclerosis  - EKG: Sinus rhythm; Minimal ST depression, diffuse leads; Borderline prolonged QT interval - After comprehensive review of possible contraindications in the chart and with her daughter by telephone [phone]), she has no absolute contraindications to TNK administration. - Patient is a TNK candidate. Discussed extensively the risks/benefits of TNK treatment vs. no treatment with the patient's daughter, including risks of hemorrhage and death with TNK administration versus worse overall outcomes on average in patients within the IV thrombolytic time window who are not administered TNK. The patient's aphasia precludes meaningful medical decision making on her part at this time. Overall benefits of TNK regarding long-term prognosis are felt to outweigh risks. The patient's daughter expressed understanding and wish to proceed with TNK.  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    RECOMMENDATIONS  1. Admitting to Neuro ICU.  2. Post-TNK order set to include frequent neuro checks and BP management.  3. No antiplatelet medications or anticoagulants for at least 24 hours following TNK.  4. DVT prophylaxis with SCDs.  5. Has diarrhea as a side effect of statins. Hold off for now.  6. Will need to start antiplatelet therapy if follow up CT at 24 hours is negative for hemorrhagic conversion. 7. Cardiac telemetry.  8. TTE.  9. MRI brain.  10. PT/OT/Speech.  11. NPO until passes swallow evaluation.  12. Discontinue estradiol.   13. Fasting lipid panel, HgbA1c 14. Code status: DNR, comfort measures protocol. No intubation or cardiac resuscitation under any circumstances. Discussed with patient and  daughter at the bedside.   ______________________________________________________________________    Bonney SHARK, Kyanne Rials, MD [facility]

## 2024-05-28 NOTE — ED Provider Notes (Signed)
 [place] EMERGENCY DEPARTMENT AT [facility] Provider Note   CSN: [id number] Arrival date & time: 05/28/24  1741  An emergency department physician performed an initial assessment on this suspected stroke patient at 1741.  Patient presents with: Code Stroke   Sara Jackson is a 83 y.o. female with PMHx of hypertension, chronic diarrhea, BPPV with multiple recent ED presentations for dizziness, hyperlipidemia and GERD who presents to the ED as a code stroke.  Last known normal was approximately 4 PM when patient began complaining of a severe headache, took 2 Tylenol  but did not experience any relief.  Roughly 30 minutes later patient began feeling weak on her left side and was confused/aphasic, so patient's daughter called EMS.  Per EMS patient was initially not able to follow any basic commands and was remarkably aphasic.  Hypertensive to 230s/110s.  She is not on blood thinners. She was reportedly taken off of all of her antihypertensives this month by her PCP. On arrival patient is complaining of a severe headache.   Prior to Admission medications   Medication Sig Start Date End Date Taking? Authorizing Provider  acetaminophen  (TYLENOL ) 325 MG tablet Take 325 mg by mouth every 6 (six) hours as needed. 07/11/14  Yes [provider]  calcium  carbonate (CALCIUM  600) 600 MG TABS tablet Take 600 mg by mouth daily with breakfast.   Yes [provider]  Cholecalciferol  (VITAMIN D3) 50 MCG (2000 UT) capsule Take 2,000 Units by mouth daily.   Yes [provider]  estradiol (ESTRACE) 1 MG tablet Take 1 mg by mouth daily.   Yes [provider]  latanoprost  (XALATAN ) 0.005 % ophthalmic solution Place 1 drop into both eyes at bedtime.   Yes [provider]  meclizine  (ANTIVERT ) 25 MG tablet Take 25 mg by mouth 3 (three) times daily as needed for dizziness.   Yes [provider]  pantoprazole  (PROTONIX ) 40 MG tablet Take 40 mg by mouth 2  (two) times daily. Patient taking differently: Take 40 mg by mouth daily. 03/10/24  Yes [provider]    Allergies: Azo [phenazopyridine], Colesevelam, Colesevelam hcl, Doxycycline hyclate, Ezetimibe, Fluoxetine, Other, Statins, Ace inhibitors, Doxycycline, Nsaids, and Risedronate sodium     Updated Vital Signs BP 134/74   Pulse 90   Temp 97.9 F (36.6 C) (Oral)   Resp 19   Ht 5' 5 (1.651 m)   Wt 61 kg   SpO2 97%   BMI 22.38 kg/m   Physical Exam Vitals reviewed.  Constitutional:      General: She is not in acute distress.    Appearance: She is underweight. She is not toxic-appearing.  HENT:     Head: Normocephalic and atraumatic.     Nose: Nose normal. No rhinorrhea.     Mouth/Throat:     Mouth: Mucous membranes are moist.     Pharynx: Oropharynx is clear.  Eyes:     Extraocular Movements: Extraocular movements intact.     Conjunctiva/sclera: Conjunctivae normal.     Pupils: Pupils are equal, round, and reactive to light.  Cardiovascular:     Rate and Rhythm: Normal rate and regular rhythm.     Pulses: Normal pulses.     Heart sounds: No murmur heard.    No gallop.  Pulmonary:     Effort: Pulmonary effort is normal. No respiratory distress.     Breath sounds: Normal breath sounds.  Abdominal:     General: Abdomen is flat.     Palpations:  Abdomen is soft.     Tenderness: There is no abdominal tenderness. There is no guarding.  Musculoskeletal:        General: No deformity.     Cervical back: Normal range of motion and neck supple. No tenderness.     Right lower leg: No edema.     Left lower leg: No edema.  Skin:    General: Skin is warm and dry.     Capillary Refill: Capillary refill takes less than 2 seconds.     Coloration: Skin is pale.     Findings: No lesion.  Neurological:     Mental Status: She is alert.     Cranial Nerves: Dysarthria (mild) and facial asymmetry (slightly flattened left NLF) present.     Motor: Weakness (subtle left hemibody  weakness) and pronator drift (LUE) present. No seizure activity.     Coordination: Finger-Nose-Finger Test normal.     Deep Tendon Reflexes: Babinski sign absent on the right side. Babinski sign absent on the left side.     Comments: Fully oriented but slightly aphasic/stuttering responses. Initial left neglect/did not recognize her left arm/hand  Psychiatric:        Mood and Affect: Affect is flat.     (all labs ordered are listed, but only abnormal results are displayed) Labs Reviewed  COMPREHENSIVE METABOLIC PANEL WITH GFR - Abnormal; Notable for the following components:      Result Value   Glucose, Bld 110 (*)    GFR, Estimated 57 (*)    All other components within normal limits  URINALYSIS, W/ REFLEX TO CULTURE (INFECTION SUSPECTED) - Abnormal; Notable for the following components:   Color, Urine STRAW (*)    Specific Gravity, Urine 1.034 (*)    All other components within normal limits  LIPID PANEL - Abnormal; Notable for the following components:   LDL Cholesterol 103 (*)    All other components within normal limits  I-STAT CHEM 8, ED - Abnormal; Notable for the following components:   Potassium 3.4 (*)    Glucose, Bld 108 (*)    All other components within normal limits  CBG MONITORING, ED - Abnormal; Notable for the following components:   Glucose-Capillary 114 (*)    All other components within normal limits  MRSA NEXT GEN BY PCR, NASAL  PROTIME-INR  APTT  CBC  DIFFERENTIAL  ETHANOL  HEMOGLOBIN A1C    EKG: None  Radiology: MR BRAIN WO CONTRAST Result Date: 05/29/2024 CLINICAL DATA:  Follow-up stroke EXAM: MRI HEAD WITHOUT CONTRAST TECHNIQUE: Multiplanar, multiecho pulse sequences of the brain and surrounding structures were obtained without intravenous contrast. COMPARISON:  October 23, 2015 FINDINGS: MRI brain: There are several foci of T2 hyperintensity in the cerebral white matter. These do not have restricted diffusion. No acute infarct. The ventricles are  normal. No mass lesion. There are normal flow signals in the carotid arteries and basilar artery. No significant bone marrow signal abnormality. No significant abnormality in the paranasal sinuses or soft tissues. IMPRESSION: No acute infarct or other significant abnormality Electronically Signed   By: Nancyann Burns M.D.   On: 05/29/2024 12:23   CT ANGIO HEAD NECK W WO CM (CODE STROKE) Result Date: 05/28/2024 CLINICAL DATA:  Neuro deficit, acute, stroke suspected. Dizziness, headache, aphasia, and left-sided weakness. EXAM: CT ANGIOGRAPHY HEAD AND NECK WITH AND WITHOUT CONTRAST TECHNIQUE: Multidetector CT imaging of the head and neck was performed using the standard protocol during bolus administration of intravenous contrast. Multiplanar CT image reconstructions  and MIPs were obtained to evaluate the vascular anatomy. Carotid stenosis measurements (when applicable) are obtained utilizing NASCET criteria, using the distal internal carotid diameter as the denominator. RADIATION DOSE REDUCTION: This exam was performed according to the departmental dose-optimization program which includes automated exposure control, adjustment of the mA and/or kV according to patient size and/or use of iterative reconstruction technique. CONTRAST:  75mL OMNIPAQUE  IOHEXOL  350 MG/ML SOLN COMPARISON:  CT chest 11/22/2018 FINDINGS: CTA NECK FINDINGS Aortic arch: Standard branching with mild atherosclerosis. Patent brachiocephalic and subclavian arteries without significant stenosis. Right carotid system: Patent with a moderate amount of predominantly soft plaque in the carotid bulb. No evidence of a significant stenosis or dissection. Left carotid system: Patent with a small amount of calcified and soft plaque at the carotid bifurcation. No evidence of a significant stenosis or dissection. Vertebral arteries: Patent without evidence of stenosis, dissection, or significant atherosclerosis. Slightly dominant right vertebral artery.  Skeleton: Cervical disc degeneration, greatest at C4-5. Other neck: No evidence of cervical lymphadenopathy or mass. Upper chest: Numerous small bilateral pulmonary nodules are similar to the 2020 chest CT and most compatible with a benign etiology. Review of the MIP images confirms the above findings CTA HEAD FINDINGS Anterior circulation: The internal carotid arteries are patent from skull base to carotid termini with mild atherosclerosis bilaterally not resulting in a significant stenosis. ACAs and MCAs are patent with mild branch vessel irregularity but no evidence of a proximal branch occlusion or significant proximal stenosis. No aneurysm is identified. Posterior circulation: The intracranial vertebral arteries are widely patent to the basilar. Patent right PICA, left AICA, and bilateral SCA origins are visualized. The basilar artery is widely patent. Posterior communicating arteries are diminutive or absent. Both PCAs are patent, and there are moderate proximal left P2 and severe mid right P2 stenoses. No aneurysm is identified. Venous sinuses: As permitted by contrast timing, patent. Anatomic variants: None. Review of the MIP images confirms the above findings These results were communicated to Dr. Lindzen at 6:10 pm on 05/28/2024 by text page via the [facility] messaging system. IMPRESSION: 1. No large vessel occlusion. 2. 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 3. Cervical carotid atherosclerosis without significant stenosis. 4.  Aortic Atherosclerosis (ICD10-I70.0). Electronically Signed   By: Dasie Hamburg M.D.   On: 05/28/2024 18:33   CT HEAD CODE STROKE WO CONTRAST Result Date: 05/28/2024 CLINICAL DATA:  Code stroke. Neuro deficit, acute, stroke suspected. Dizziness, headache, aphasia, and left-sided weakness. EXAM: CT HEAD WITHOUT CONTRAST TECHNIQUE: Contiguous axial images were obtained from the base of the skull through the vertex without intravenous contrast.  RADIATION DOSE REDUCTION: This exam was performed according to the departmental dose-optimization program which includes automated exposure control, adjustment of the mA and/or kV according to patient size and/or use of iterative reconstruction technique. COMPARISON:  Head CT 04/27/2024 FINDINGS: Brain: There is no evidence of an acute infarct, intracranial hemorrhage, mass, midline shift, or extra-axial fluid collection. Cerebral volume is normal for age. The ventricles are normal in size. Vascular: Calcified atherosclerosis at the skull base. No hyperdense vessel. Skull: No fracture or suspicious lesion. Sinuses/Orbits: The included paranasal sinuses and mastoid air cells are well aerated. Bilateral cataract extraction. Other: None. ASPECTS [facility] Stroke Program Early CT Score) - Ganglionic level infarction (caudate, lentiform nuclei, internal capsule, insula, M1-M3 cortex): 7 - Supraganglionic infarction (M4-M6 cortex): 3 Total score (0-10 with 10 being normal): 10 These results were communicated to Dr. Lindzen at 5:59 pm on 05/28/2024 by  text page via the [facility] messaging system. IMPRESSION: No evidence of acute intracranial abnormality. ASPECTS of 10. Electronically Signed   By: Dasie Hamburg M.D.   On: 05/28/2024 18:10     Medications Ordered in the ED  clevidipine  (CLEVIPREX ) infusion 0.5 mg/mL (0 mg/hr Intravenous Stopped 05/28/24 2235)  0.9 %  sodium chloride  infusion ( Intravenous Infusion Verify 05/29/24 1000)  acetaminophen  (TYLENOL ) tablet 650 mg (650 mg Oral Given 05/28/24 2100)    Or  acetaminophen  (TYLENOL ) 160 MG/5ML solution 650 mg ( Per Tube See Alternative 05/28/24 2100)    Or  acetaminophen  (TYLENOL ) suppository 650 mg ( Rectal See Alternative 05/28/24 2100)  senna-docusate (Senokot-S) tablet 1 tablet (has no administration in time range)  pantoprazole  (PROTONIX ) injection 40 mg (40 mg Intravenous Not Given 05/28/24 2043)  calcium  carbonate (TUMS - dosed in mg elemental calcium ) chewable  tablet 200 mg of elemental calcium  (has no administration in time range)  cholecalciferol  (VITAMIN D3) 25 MCG (1000 UNIT) tablet 2,000 Units (2,000 Units Oral Given 05/29/24 0905)  multivitamin with minerals tablet 1 tablet (1 tablet Oral Given 05/29/24 0905)  Chlorhexidine  Gluconate Cloth 2 % PADS 6 each (6 each Topical Given 05/29/24 0905)  Oral care mouth rinse (has no administration in time range)  latanoprost  (XALATAN ) 0.005 % ophthalmic solution 1 drop (1 drop Both Eyes Given 05/28/24 2126)  sodium chloride  flush (NS) 0.9 % injection 3 mL (3 mLs Intravenous Given 05/28/24 1832)  prochlorperazine  (COMPAZINE ) injection 10 mg (10 mg Intravenous Given 05/28/24 1800)  iohexol  (OMNIPAQUE ) 350 MG/ML injection 75 mL (75 mLs Intravenous Contrast Given 05/28/24 1815)  tenecteplase  (TNKASE ) injection for Stroke 15 mg (15 mg Intravenous Given 05/28/24 1831)   stroke: early stages of recovery book ( Does not apply Given 05/29/24 0905)    Clinical Course as of 05/29/24 1239  Sun May 28, 2024  1822 CT HEAD CODE STROKE WO CONTRAST No evidence of acute intracranial abnormality. ASPECTS of 10. [AD]  1836 CT ANGIO HEAD NECK W WO CM (CODE STROKE) No large vessel occlusion. 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 Cervical carotid atherosclerosis without significant stenosis. Aortic Atherosclerosis   [AD]  1837 Alcohol, Ethyl (B): <15 [AD]    Clinical Course User Index [AD] Raoul Rake, MD    Medical Decision Making Patient with the above history is presenting with acute onset of dysarthria, left hemibody weakness, and aphasia in setting of acute hypertension to 230s/110s after being told to discontinue all antihypertensives by her PCP recently. She otherwise has no traumatic head injuries or falls recently and is not on thinners. A code stroke was called per EMS and neurology provider was at bedside to evaluate patient during my initial evaluation. Initial  NIHSS 11. Code stroke scans (CTH, CTA head/neck) were overall unremarkable with no obvious bleed or LVO. Neurology team initiated TNK shortly after arrival and placed patient on cleviprex  for SBP goal <180. Basic laboratory workup was without acute abnormalities. I updated patient's daughter at bedside and re-evaluated patient with no appreciated change in neuro exam from initial. Patient was then admitted to neuro ICU in overall stable condition for further close monitoring and management.  Amount and/or Complexity of Data Reviewed Labs: ordered. Decision-making details documented in ED Course. Radiology: ordered. Decision-making details documented in ED Course.  Risk Prescription drug management. Decision regarding hospitalization.    Final diagnoses:  Stroke-like symptoms  Hypertensive emergency  Acute nonintractable headache, unspecified headache type      Raoul Rake,  MD 05/29/24 1240    Lenor Hollering, MD 05/31/24 [phone]

## 2024-05-28 NOTE — ED Triage Notes (Signed)
 Pt to er via ems and taken directly to CT, per ems pt's last know well was 4pm, states that at 4pm she stared having a headache and dizziness, states that she has some L facial droop and was staggering.

## 2024-05-28 NOTE — Plan of Care (Signed)
 Alert and oriented.  MAE equally.  Denies h/a.  Passed swallow eval.  Mri pending

## 2024-05-28 NOTE — ED Notes (Signed)
 CCMD called and notified

## 2024-05-29 ENCOUNTER — Inpatient Hospital Stay (HOSPITAL_COMMUNITY)

## 2024-05-29 ENCOUNTER — Ambulatory Visit: Admitting: Physician Assistant

## 2024-05-29 DIAGNOSIS — I639 Cerebral infarction, unspecified: Secondary | ICD-10-CM | POA: Diagnosis not present

## 2024-05-29 DIAGNOSIS — R29700 NIHSS score 0: Secondary | ICD-10-CM | POA: Diagnosis not present

## 2024-05-29 DIAGNOSIS — I10 Essential (primary) hypertension: Secondary | ICD-10-CM | POA: Diagnosis not present

## 2024-05-29 DIAGNOSIS — G459 Transient cerebral ischemic attack, unspecified: Secondary | ICD-10-CM

## 2024-05-29 LAB — ECHOCARDIOGRAM COMPLETE
Area-P 1/2: 3.83 cm2
Calc EF: 59.8 %
Height: 65 in
S' Lateral: 2.1 cm
Single Plane A2C EF: 63.2 %
Single Plane A4C EF: 58 %
Weight: 2151.69 [oz_av]

## 2024-05-29 LAB — LIPID PANEL
Cholesterol: 199 mg/dL (ref 0–200)
HDL: 80 mg/dL (ref >40)
LDL Cholesterol: 103 mg/dL — ABNORMAL HIGH (ref 0–99)
Total CHOL/HDL Ratio: 2.5 ratio
Triglycerides: 80 mg/dL (ref <150)
VLDL: 16 mg/dL (ref 0–40)

## 2024-05-29 MED ORDER — ASPIRIN 325 MG PO TABS: 325.0000 mg | ORAL_TABLET | Freq: Every day | ORAL | Status: DC

## 2024-05-29 MED ORDER — PANTOPRAZOLE SODIUM 40 MG PO TBEC
40.0000 mg | DELAYED_RELEASE_TABLET | Freq: Every day | ORAL | Status: DC
  Administered 2024-05-29: 40 mg via ORAL
  Filled 2024-05-29: qty 1

## 2024-05-29 MED ORDER — ASPIRIN 325 MG PO TABS
325.0000 mg | ORAL_TABLET | Freq: Every day | ORAL | Status: DC
  Administered 2024-05-29: 325 mg via ORAL
  Filled 2024-05-29: qty 1

## 2024-05-29 NOTE — Evaluation (Signed)
 Occupational Therapy Evaluation Patient Details Name: Sara Jackson MRN: [id number] DOB: [birth year] Today's Date: 05/29/2024   History of Present Illness   83 yo female presenting to ED 7/27 with aphasia, headache, and L sided weakness. S/p TNK. MRI negative. NIHSS improved 11 to 4. PMH includes HTN, HLD, GERD, BPPV with multiple recent ED presentations for dizziness     Clinical Impressions Pt ind at baseline with ADLs, uses rollator for mobility, lives with daughter. Pt currently close to baseline, reports feeling globally a little weaker than normal. Pt needing CGA for ADLs, ind with bed mobility and min A for transfers with RW. Pt amb short hall distance with HR up to 120s during session. Pt presenting with impairments listed below, will follow acutely. Anticipate no OT follow up needs at d/c.      If plan is discharge home, recommend the following:   A little help with walking and/or transfers;A little help with bathing/dressing/bathroom;Assistance with cooking/housework;Direct supervision/assist for financial management;Direct supervision/assist for medications management;Assist for transportation;Help with stairs or ramp for entrance     Functional Status Assessment   Patient has had a recent decline in their functional status and demonstrates the ability to make significant improvements in function in a reasonable and predictable amount of time.     Equipment Recommendations   None recommended by OT     Recommendations for Other Services   PT consult     Precautions/Restrictions   Precautions Precautions: Fall Recall of Precautions/Restrictions: Intact Precaution/Restrictions Comments: denies falls Restrictions Weight Bearing Restrictions Per Provider Order: No     Mobility Bed Mobility Overal bed mobility: Independent                  Transfers Overall transfer level: Needs assistance Equipment used: Rolling walker (2 wheels) Transfers:  Sit to/from Stand Sit to Stand: Min assist                  Balance Overall balance assessment: Needs assistance Sitting-balance support: No upper extremity supported, Feet unsupported Sitting balance-Leahy Scale: Good Sitting balance - Comments: reaching to her feet to doff and don a sock   Standing balance support: Bilateral upper extremity supported Standing balance-Leahy Scale: Poor                             ADL either performed or assessed with clinical judgement   ADL Overall ADL's : Needs assistance/impaired Eating/Feeding: Set up;Sitting   Grooming: Set up;Standing   Upper Body Bathing: Contact guard assist;Sitting;Standing   Lower Body Bathing: Contact guard assist;Sit to/from stand;Sitting/lateral leans   Upper Body Dressing : Contact guard assist;Sitting;Standing   Lower Body Dressing: Contact guard assist;Sit to/from stand;Sitting/lateral leans   Toilet Transfer: Contact guard assist;Ambulation;Rolling walker (2 wheels)           Functional mobility during ADLs: Contact guard assist;Rolling walker (2 wheels)       Vision   Vision Assessment?: No apparent visual deficits     Perception Perception: Not tested       Praxis Praxis: Not tested       Pertinent Vitals/Pain Pain Assessment Pain Assessment: No/denies pain     Extremity/Trunk Assessment Upper Extremity Assessment Upper Extremity Assessment: Generalized weakness   Lower Extremity Assessment Lower Extremity Assessment: Defer to PT evaluation RLE Deficits / Details: Strength 5/5 RLE Sensation: history of peripheral neuropathy LLE Deficits / Details: strength 5/5 LLE Sensation: history of peripheral neuropathy  Cervical / Trunk Assessment Cervical / Trunk Assessment: Normal   Communication Communication Communication: No apparent difficulties   Cognition Arousal: Alert Behavior During Therapy: WFL for tasks assessed/performed               OT -  Cognition Comments: mild STM deficits, but appears close to baseline                 Following commands: Intact       Cueing  General Comments   Cueing Techniques: Verbal cues  VSS   Exercises     Shoulder Instructions      Home Living Family/patient expects to be discharged to:: Private residence Living Arrangements: Children (daughter) Available Help at Discharge: Family;Available PRN/intermittently Type of Home: House Home Access: Stairs to enter [employer] of Steps: 3 Entrance Stairs-Rails: Left Home Layout: One level     Bathroom Shower/Tub: Producer, television/film/video: Handicapped height Bathroom Accessibility: Yes   Home Equipment: Rollator (4 wheels);Wheelchair - manual;Shower seat;Grab bars - tub/shower;Hand held shower head      Lives With: Daughter    Prior Functioning/Environment Prior Level of Function : Needs assist;Driving             Mobility Comments: using rollator ADLs Comments: doesn't cook; does laundry, some cleaning    OT Problem List: Decreased strength;Decreased range of motion;Decreased activity tolerance;Impaired balance (sitting and/or standing)   OT Treatment/Interventions: Self-care/ADL training;Therapeutic exercise;Energy conservation;DME and/or AE instruction;Therapeutic activities;Balance training;Patient/family education      OT Goals(Current goals can be found in the care plan section)   Acute Rehab OT Goals Patient Stated Goal: none stated OT Goal Formulation: With patient Time For Goal Achievement: 06/12/24 Potential to Achieve Goals: Good ADL Goals Pt Will Perform Upper Body Dressing: with modified independence;sitting Pt Will Perform Lower Body Dressing: with modified independence;sitting/lateral leans;sit to/from stand Pt Will Transfer to Toilet: with modified independence;ambulating;regular height toilet Pt Will Perform Tub/Shower Transfer: Tub transfer;Shower transfer;with modified  independence;ambulating   OT Frequency:  Min 2X/week    Co-evaluation   Reason for Co-Treatment: To address functional/ADL transfers PT goals addressed during session: Mobility/safety with mobility;Balance;Proper use of DME        AM-PAC OT 6 Clicks Daily Activity     Outcome Measure Help from another person eating meals?: A Little Help from another person taking care of personal grooming?: A Little Help from another person toileting, which includes using toliet, bedpan, or urinal?: A Little Help from another person bathing (including washing, rinsing, drying)?: A Little Help from another person to put on and taking off regular upper body clothing?: A Little Help from another person to put on and taking off regular lower body clothing?: A Little 6 Click Score: 18   End of Session Equipment Utilized During Treatment: Gait belt;Rolling walker (2 wheels) Nurse Communication: Mobility status  Activity Tolerance: Patient tolerated treatment well Patient left: in bed;with call bell/phone within reach;with bed alarm set;with family/visitor present  OT Visit Diagnosis: Other abnormalities of gait and mobility (R26.89);Unsteadiness on feet (R26.81);Muscle weakness (generalized) (M62.81)                Time: 8491-8475 OT Time Calculation (min): 16 min Charges:  OT General Charges $OT Visit: 1 Visit OT Evaluation $OT Eval Low Complexity: 1 Low  Laneta POUR, OTD, OTR/L SecureChat Preferred Acute Rehab [phone]   Laneta POUR [place] 05/29/2024, 4:41 PM

## 2024-05-29 NOTE — Evaluation (Signed)
 Physical Therapy Evaluation Patient Details Name: Sara Jackson MRN: [id number] DOB: May 28, 1941 Today's Date: 05/29/2024  History of Present Illness  83 yo female presenting to ED 7/27 with aphasia, headache, and L sided weakness. S/p TNK. MRI negative. NIHSS improved 11 to 4. PMH includes HTN, HLD, GERD, BPPV with multiple recent ED presentations for dizziness  Clinical Impression  Pt admitted secondary to problem above with deficits below. PTA patient lives with daughter in one level home with 2 steps to enter with railing. She has been using a rollator due to long history of dizziness and bil foot numbness.  Pt currently required min assist for sit to stand with RW and CGA for ambulation. No doubt she would do better with a rollator as this is what she normally uses. Reports she is close to her baseline, just feels a little weak globally.  Anticipate patient will benefit from PT to address problems listed below. Will continue to follow acutely to maximize functional mobility, independence, and safety.  Patient will be followed for acute PT and will not need any followup PT upon discharge.          If plan is discharge home, recommend the following: A little help with walking and/or transfers;Assistance with cooking/housework;Assist for transportation;Help with stairs or ramp for entrance   Can travel by private vehicle        Equipment Recommendations None recommended by PT  Recommendations for Other Services       Functional Status Assessment Patient has had a recent decline in their functional status and demonstrates the ability to make significant improvements in function in a reasonable and predictable amount of time.     Precautions / Restrictions Precautions Precautions: Fall Recall of Precautions/Restrictions: Intact Precaution/Restrictions Comments: denies falls      Mobility  Bed Mobility Overal bed mobility: Independent                   Transfers Overall transfer level: Needs assistance Equipment used: Rolling walker (2 wheels) Transfers: Sit to/from Stand Sit to Stand: Min assist           General transfer comment: pt accustomed to rollator; RW anchored by PT as pt came to stand    Ambulation/Gait Ambulation/Gait assistance: Contact guard assist Gait Distance (Feet): 90 Feet Assistive device: Rolling walker (2 wheels) Gait Pattern/deviations: Step-through pattern, Decreased stride length       General Gait Details: initially staying very close to rt side of hallway but not running into objects  Stairs            Wheelchair Mobility     Tilt Bed    Modified Rankin (Stroke Patients Only) Modified Rankin (Stroke Patients Only) Pre-Morbid Rankin Score: No significant disability Modified Rankin: No significant disability     Balance Overall balance assessment: Needs assistance Sitting-balance support: No upper extremity supported, Feet unsupported Sitting balance-Leahy Scale: Good Sitting balance - Comments: reaching to her feet to doff and don a sock   Standing balance support: Bilateral upper extremity supported Standing balance-Leahy Scale: Poor                               Pertinent Vitals/Pain Pain Assessment Pain Assessment: No/denies pain    Home Living Family/patient expects to be discharged to:: Private residence Living Arrangements: Children (daughter) Available Help at Discharge: Family;Available PRN/intermittently (dtr there most of the time) Type of Home: House Home Access: Stairs to  enter Entrance Stairs-Rails: Left Entrance Stairs-Number of Steps: 3   Home Layout: One level Home Equipment: Rollator (4 wheels);Wheelchair - manual;Shower seat;Grab bars - tub/shower;Hand held shower head      Prior Function Prior Level of Function : Needs assist;Driving             Mobility Comments: using rollator ADLs Comments: doesn't cook; does laundry, some  cleaning     Extremity/Trunk Assessment   Upper Extremity Assessment Upper Extremity Assessment: Defer to OT evaluation    Lower Extremity Assessment Lower Extremity Assessment: RLE deficits/detail;LLE deficits/detail RLE Deficits / Details: Strength 5/5 RLE Sensation: history of peripheral neuropathy LLE Deficits / Details: strength 5/5 LLE Sensation: history of peripheral neuropathy    Cervical / Trunk Assessment Cervical / Trunk Assessment: Normal  Communication   Communication Communication: No apparent difficulties    Cognition Arousal: Alert Behavior During Therapy: WFL for tasks assessed/performed   PT - Cognitive impairments: History of cognitive impairments                       PT - Cognition Comments: see SLP evaluation; pt likely at baseline Following commands: Intact       Cueing Cueing Techniques: Verbal cues     General Comments General comments (skin integrity, edema, etc.): Daughter present. BP increased with sit to stand although pt reported she felt mildly dizzy.    Exercises     Assessment/Plan    PT Assessment Patient needs continued PT services  PT Problem List Decreased balance;Decreased mobility;Decreased knowledge of use of DME;Impaired sensation       PT Treatment Interventions DME instruction;Gait training;Stair training;Functional mobility training;Therapeutic activities;Balance training;Neuromuscular re-education;Patient/family education    PT Goals (Current goals can be found in the Care Plan section)  Acute Rehab PT Goals Patient Stated Goal: return home PT Goal Formulation: With patient Time For Goal Achievement: 06/12/24 Potential to Achieve Goals: Good    Frequency Min 2X/week     Co-evaluation PT/OT/SLP Co-Evaluation/Treatment: Yes Reason for Co-Treatment: To address functional/ADL transfers PT goals addressed during session: Mobility/safety with mobility;Balance;Proper use of DME         AM-PAC PT 6  Clicks Mobility  Outcome Measure Help needed turning from your back to your side while in a flat bed without using bedrails?: None Help needed moving from lying on your back to sitting on the side of a flat bed without using bedrails?: None Help needed moving to and from a bed to a chair (including a wheelchair)?: A Little Help needed standing up from a chair using your arms (e.g., wheelchair or bedside chair)?: A Little Help needed to walk in hospital room?: A Little Help needed climbing 3-5 steps with a railing? : A Little 6 Click Score: 20    End of Session Equipment Utilized During Treatment: Gait belt Activity Tolerance: Patient tolerated treatment well Patient left: in bed;with call bell/phone within reach;with bed alarm set;with family/visitor present Nurse Communication: Mobility status PT Visit Diagnosis: Unsteadiness on feet (R26.81);Dizziness and giddiness (R42)    Time: 8496-8475 PT Time Calculation (min) (ACUTE ONLY): 21 min   Charges:   PT Evaluation $PT Eval Low Complexity: 1 Low   PT General Charges $$ ACUTE PT VISIT: 1 Visit          [name], PT Acute Rehabilitation Services  Office [phone]   [name] 05/29/2024, 3:45 PM

## 2024-05-29 NOTE — Progress Notes (Signed)
 Transition of Care [facility]) - Inpatient Brief Assessment   Patient Details  Name: Sara Jackson MRN: [id number] Date of Birth: 06-13-1941  [facility] CM/SW Contact:    Robynn Eileen Hoose, RN Phone Number: 05/29/2024, 10:24 AM   Clinical Narrative:  Patient from home with headache and dizziness. Per PT/OT note, patient currently on bedrest unable to evaluate at this time.   Transition of Care Asessment: Insurance and Status: (P) Insurance coverage has been reviewed Patient has primary care physician: (P) No (not listed) Home environment has been reviewed: (P) Home Prior level of function:: (P) Independent Prior/Current Home Services: (P) No current home services Social Drivers of Health Review: (P) SDOH reviewed no interventions necessary Readmission risk has been reviewed: (P) Yes Transition of care needs: (P) no transition of care needs at this time

## 2024-05-29 NOTE — Evaluation (Signed)
 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   Hypertensive crisis 04/27/2024   Dizziness 04/27/2024   Esophageal dysphagia 07/28/2023   Problems with swallowing and mastication    Hiatal hernia    Stricture and stenosis of esophagus    Yeast vaginitis 07/09/2015   AP (abdominal pain) 07/04/2015   Vaginal atrophy 07/04/2015   Bladder pain 07/04/2015   Cystocele, midline 07/04/2015   IC (interstitial cystitis) 07/04/2015   Pain in thoracic spine 02/21/2015   Impingement syndrome of shoulder 10/18/2014   Benign paroxysmal positional nystagmus 07/11/2014   Essential (primary) hypertension 07/11/2014   Gastro-esophageal reflux disease without esophagitis 07/11/2014   Glaucoma 07/11/2014   Hypercholesteremia 07/11/2014   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   Low back pain 05/02/2014   Bursitis, trochanteric 05/02/2014   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   Past Medical History:  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   Vaginal atrophy    Past Surgical History:  Past Surgical History:  Procedure Laterality Date   BIOPSY  07/28/2023   Procedure: BIOPSY;  Surgeon: Unk Corinn Skiff, MD;   Location: [facility];  Service: Gastroenterology;;   BREAST BIOPSY Left    CATARACT EXTRACTION W/PHACO Right 08/03/2022   Procedure: CATARACT EXTRACTION PHACO AND INTRAOCULAR LENS PLACEMENT (IOC) RIGHT CLARION VIVITY TORIC  LENS  4.49 00:31.4;  Surgeon: Myrna Adine Anes, MD;  Location: [facility];  Service: Ophthalmology;  Laterality: Right;   CATARACT EXTRACTION W/PHACO Left 08/17/2022   Procedure: CATARACT EXTRACTION PHACO AND INTRAOCULAR LENS PLACEMENT (IOC) LEFT CLARION VIVITY TORIC LENS HYDRUS MICROSTENT;  Surgeon: Myrna Adine Anes, MD;  Location: [facility];  Service: Ophthalmology;  Laterality: Left;  4.49 0:37.0   ESOPHAGOGASTRODUODENOSCOPY (EGD) WITH PROPOFOL  N/A 03/12/2016   Procedure: ESOPHAGOGASTRODUODENOSCOPY (EGD) WITH PROPOFOL  with dilation;  Surgeon: Rogelia Copping, MD;  Location: [facility];  Service: Endoscopy;  Laterality: N/A;   ESOPHAGOGASTRODUODENOSCOPY (EGD) WITH PROPOFOL  N/A 07/28/2023   Procedure: ESOPHAGOGASTRODUODENOSCOPY (EGD) WITH PROPOFOL ;  Surgeon: Unk Corinn Skiff, MD;  Location: [facility] ENDOSCOPY;  Service: Gastroenterology;  Laterality: N/A;   MELANOMA EXCISION     RADICAL HYSTERECTOMY     HPI:  Sara Jackson is an 83 yo female presenting to ED 7/27 with aphasia, headache, and L sided weakness. S/p TNK. MRI negative. PMH includes HTN, HLD, GERD, BPPV with multiple recent ED presentations for dizziness   Assessment / Plan / Recommendation Clinical Impression  Pt states she is typically independent at home, where she lives with her daughter. She presents with deficits related to sustained attention, auditory comprehension, memory, and problem solving evidenced by chosen subsections of the Cognistat. She did not demonstrate awareness of errors but states that her performance is consistent with  baseline, which may be the case given negative MRI findings and confirmation from her daughter. Discussed monitoring for acute changes and  increased supervision. Will sign off acutely.    SLP Assessment  SLP Recommendation/Assessment: Patient does not need any further Speech Language Pathology Services SLP Visit Diagnosis: Cognitive communication deficit (R41.841)     Assistance Recommended at Discharge  PRN  Functional Status Assessment Patient has not had a recent decline in their functional status  Frequency and Duration           SLP Evaluation Cognition  Overall Cognitive Status: History of cognitive impairments - at baseline Arousal/Alertness: Awake/alert Orientation Level: Oriented X4 Attention: Sustained Sustained Attention: Impaired Sustained Attention Impairment: Verbal basic Memory: Impaired Memory Impairment: Storage deficit;Retrieval deficit Awareness: Impaired Awareness Impairment: Intellectual impairment Problem Solving: Impaired Problem Solving Impairment: Verbal basic Executive Function: Reasoning Reasoning: Appears intact       Comprehension  Auditory Comprehension Overall Auditory Comprehension: Impaired Yes/No Questions: Not tested Commands: Impaired Multistep Basic Commands: 25-49% accurate    Expression Expression Primary Mode of Expression: Verbal Verbal Expression Overall Verbal Expression: Appears within functional limits for tasks assessed   Oral / Motor  Oral Motor/Sensory Function Overall Oral Motor/Sensory Function: Within functional limits Motor Speech Overall Motor Speech: Appears within functional limits for tasks assessed            Damien Blumenthal, M.A., CCC-SLP Speech Language Pathology, Acute Rehabilitation Services  Secure Chat preferred [phone]  05/29/2024, 1:27 PM

## 2024-05-29 NOTE — Progress Notes (Signed)
 PT Cancellation Note  Patient Details Name: Sara Jackson MRN: [id number] DOB: May 05, 1941   Cancelled Treatment:    Reason Eval/Treat Not Completed: Active bedrest order  Will follow and proceed with evaluation when bedrest orders lifted.    Sara Jackson, PT Acute Rehabilitation Services  Office [phone]  Sara Jackson 05/29/2024, 7:52 AM

## 2024-05-29 NOTE — TOC CAGE-AID Note (Signed)
 Transition of Care [facility]) - CAGE-AID Screening   Patient Details  Name: Sara Jackson MRN: [id number] Date of Birth: 01-May-1941  [facility] CM/SW Contact:    Inocente GORMAN Kindle, LCSW Phone Number: 05/29/2024, 9:51 AM   Clinical Narrative: Patient declined substance use. Resources not provided.    CAGE-AID Screening:    Have You Ever Felt You Ought to Cut Down on Your Drinking or Drug Use?: No Have People Annoyed You By Critizing Your Drinking Or Drug Use?: No Have You Felt Bad Or Guilty About Your Drinking Or Drug Use?: No Have You Ever Had a Drink or Used Drugs First Thing In The Morning to Steady Your Nerves or to Get Rid of a Hangover?: No CAGE-AID Score: 0  Substance Abuse Education Offered: No

## 2024-05-29 NOTE — Progress Notes (Addendum)
 STROKE TEAM PROGRESS NOTE    SIGNIFICANT HOSPITAL EVENTS 7/27: Patient admitted with aphasia, headache and left-sided weakness, given TNK to treat stroke  INTERIM HISTORY/SUBJECTIVE Patient states her speech difficulties, headache and left-sided weakness resolved shortly after receiving tPA injection.  She feels she is back to her baseline this morning.  Blood pressure is adequately controlled. Patient has been largely hemodynamically stable with some transient hypotension overnight.  She has been afebrile.  She reports that she is doing better, and her speech appears to be back to normal.  OBJECTIVE  CBC    Component Value Date/Time   WBC 8.4 05/28/2024 1743   RBC 4.17 05/28/2024 1743   HGB 13.6 05/28/2024 1747   HCT 40.0 05/28/2024 1747   PLT 316 05/28/2024 1743   MCV 93.8 05/28/2024 1743   MCH 30.5 05/28/2024 1743   MCHC 32.5 05/28/2024 1743   RDW 13.1 05/28/2024 1743   LYMPHSABS 3.0 05/28/2024 1743   MONOABS 0.5 05/28/2024 1743   EOSABS 0.1 05/28/2024 1743   BASOSABS 0.1 05/28/2024 1743    BMET    Component Value Date/Time   NA 139 05/28/2024 1747   K 3.4 (L) 05/28/2024 1747   CL 100 05/28/2024 1747   CO2 27 05/28/2024 1743   GLUCOSE 108 (H) 05/28/2024 1747   BUN 16 05/28/2024 1747   CREATININE 1.00 05/28/2024 1747   CALCIUM  9.3 05/28/2024 1743   GFRNONAA 57 (L) 05/28/2024 1743    IMAGING past 24 hours CT ANGIO HEAD NECK W WO CM (CODE STROKE) Result Date: 05/28/2024 CLINICAL DATA:  Neuro deficit, acute, stroke suspected. Dizziness, headache, aphasia, and left-sided weakness. EXAM: CT ANGIOGRAPHY HEAD AND NECK WITH AND WITHOUT CONTRAST TECHNIQUE: Multidetector CT imaging of the head and neck was performed using the standard protocol during bolus administration of intravenous contrast. Multiplanar CT image reconstructions and MIPs were obtained to evaluate the vascular anatomy. Carotid stenosis measurements (when applicable) are obtained utilizing NASCET criteria,  using the distal internal carotid diameter as the denominator. RADIATION DOSE REDUCTION: This exam was performed according to the departmental dose-optimization program which includes automated exposure control, adjustment of the mA and/or kV according to patient size and/or use of iterative reconstruction technique. CONTRAST:  75mL OMNIPAQUE  IOHEXOL  350 MG/ML SOLN COMPARISON:  CT chest 11/22/2018 FINDINGS: CTA NECK FINDINGS Aortic arch: Standard branching with mild atherosclerosis. Patent brachiocephalic and subclavian arteries without significant stenosis. Right carotid system: Patent with a moderate amount of predominantly soft plaque in the carotid bulb. No evidence of a significant stenosis or dissection. Left carotid system: Patent with a small amount of calcified and soft plaque at the carotid bifurcation. No evidence of a significant stenosis or dissection. Vertebral arteries: Patent without evidence of stenosis, dissection, or significant atherosclerosis. Slightly dominant right vertebral artery. Skeleton: Cervical disc degeneration, greatest at C4-5. Other neck: No evidence of cervical lymphadenopathy or mass. Upper chest: Numerous small bilateral pulmonary nodules are similar to the 2020 chest CT and most compatible with a benign etiology. Review of the MIP images confirms the above findings CTA HEAD FINDINGS Anterior circulation: The internal carotid arteries are patent from skull base to carotid termini with mild atherosclerosis bilaterally not resulting in a significant stenosis. ACAs and MCAs are patent with mild branch vessel irregularity but no evidence of a proximal branch occlusion or significant proximal stenosis. No aneurysm is identified. Posterior circulation: The intracranial vertebral arteries are widely patent to the basilar. Patent right PICA, left AICA, and bilateral SCA origins are visualized. The basilar artery  is widely patent. Posterior communicating arteries are diminutive or absent.  Both PCAs are patent, and there are moderate proximal left P2 and severe mid right P2 stenoses. No aneurysm is identified. Venous sinuses: As permitted by contrast timing, patent. Anatomic variants: None. Review of the MIP images confirms the above findings These results were communicated to Dr. Lindzen at 6:10 pm on 05/28/2024 by text page via the [facility] messaging system. IMPRESSION: 1. No large vessel occlusion. 2. 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 3. Cervical carotid atherosclerosis without significant stenosis. 4.  Aortic Atherosclerosis (ICD10-I70.0). Electronically Signed   By: Dasie Hamburg M.D.   On: 05/28/2024 18:33   CT HEAD CODE STROKE WO CONTRAST Result Date: 05/28/2024 CLINICAL DATA:  Code stroke. Neuro deficit, acute, stroke suspected. Dizziness, headache, aphasia, and left-sided weakness. EXAM: CT HEAD WITHOUT CONTRAST TECHNIQUE: Contiguous axial images were obtained from the base of the skull through the vertex without intravenous contrast. RADIATION DOSE REDUCTION: This exam was performed according to the departmental dose-optimization program which includes automated exposure control, adjustment of the mA and/or kV according to patient size and/or use of iterative reconstruction technique. COMPARISON:  Head CT 04/27/2024 FINDINGS: Brain: There is no evidence of an acute infarct, intracranial hemorrhage, mass, midline shift, or extra-axial fluid collection. Cerebral volume is normal for age. The ventricles are normal in size. Vascular: Calcified atherosclerosis at the skull base. No hyperdense vessel. Skull: No fracture or suspicious lesion. Sinuses/Orbits: The included paranasal sinuses and mastoid air cells are well aerated. Bilateral cataract extraction. Other: None. ASPECTS ([place] Stroke Program Early CT Score) - Ganglionic level infarction (caudate, lentiform nuclei, internal capsule, insula, M1-M3 cortex): 7 - Supraganglionic infarction (M4-M6  cortex): 3 Total score (0-10 with 10 being normal): 10 These results were communicated to Dr. Lindzen at 5:59 pm on 05/28/2024 by text page via the [facility] messaging system. IMPRESSION: No evidence of acute intracranial abnormality. ASPECTS of 10. Electronically Signed   By: Dasie Hamburg M.D.   On: 05/28/2024 18:10    Vitals:   05/29/24 0700 05/29/24 0730 05/29/24 0800 05/29/24 0900  BP: (!) 132/53 (!) 153/59 (!) 158/63 (!) 142/67  Pulse: 67 75 76 75  Resp: 20 19 19 15   Temp:   97.9 F (36.6 C)   TempSrc:   Oral   SpO2: 95% 97% 97% 97%  Weight:      Height:         PHYSICAL EXAM General:  Alert, well-nourished, well-developed patient in no acute distress Psych:  Mood and affect appropriate for situation CV: Regular rate and rhythm on monitor Respiratory:  Regular, unlabored respirations on room air   NEURO:  Mental Status: AA&Ox3, patient is able to give clear and coherent history Speech/Language: speech is without dysarthria or aphasia.  Naming, repetition, fluency, and comprehension intact.  Cranial Nerves:  II: PERRL. Visual fields full.  III, IV, VI: EOMI. Eyelids elevate symmetrically.  V: Sensation is intact to light touch and symmetrical to face.  VII: Face is symmetrical resting and smiling VIII: hearing intact to voice. IX, X: Phonation is normal.  KP:Dynloizm shrug 5/5. XII: tongue is midline without fasciculations. Motor: 5/5 strength to all muscle groups tested.  Tone: is normal and bulk is normal Sensation- Intact to light touch bilaterally. Extinction absent to light touch to DSS.   Coordination: FTN intact bilaterally, HKS: no ataxia in BLE. Gait- deferred  Most Recent NIH 0  ASSESSMENT/PLAN  Sara Jackson is a 83 y.o.  female with history of hypertension, chronic diarrhea, BPPV, hyperlipidemia and GERD admitted for headache, aphasia and left-sided weakness.  She is left-handed.  Patient was given TNK to treat stroke once blood pressure was brought  under control.  NIH on Admission 11  Stroke aborted by TNK Etiology: Uncertain at this time Code Stroke CT head No acute abnormality. ASPECTS 10.    CTA head & neck no LVO, intracranial atherosclerosis including severe right and moderate left P2 stenoses MRI negative for acute infarct 24-hour follow-up CT pending 2D Echo pending LDL 103 HgbA1c 4.8 VTE prophylaxis -SCDs No antithrombotic prior to admission, now on No antithrombotic as she is less than 24 hours from TNK administration Therapy recommendations:  Pending Disposition: Pending  Hypertension Home meds: None, medications recently stopped by cardiologist Stable Blood Pressure Goal: BP less than 180/105   Hyperlipidemia Home meds: None, intolerant to statins and ezetimibe LDL 103, goal < 70 High intensity statin not indicated due to previous intolerance May consider Leqvio or PCSK9 inhibitor  Other Stroke Risk Factors None   Other Active Problems None  Hospital day # 1  Patient seen by NP with MD, MD to edit note as needed. Cortney E Everitt Clint Kill , MSN, AGACNP-BC [facility] See Amion for schedule and pager information 05/29/2024 10:24 AM   I have personally obtained history,examined this patient, reviewed notes, independently viewed imaging studies, participated in medical decision making and plan of care.ROS completed by me personally and pertinent positives fully documented  I have made any additions or clarifications directly to the above note. Agree with note above.  Patient presented as an outside hospital with sudden onset of headache aphasia and left-sided weakness and was given IV tPA by teleneurologist and symptoms resolved shortly thereafter.  MRI however is negative for acute stroke.  This is likely TIA or tPA aborted stroke.  Continue close neurological observation and strict blood pressure control as per post thrombolysis protocol.  Mobilize out of bed.  Therapy consults.  Continue ongoing  stroke workup.  Long discussion with patient and daughter at the bedside and answered questions. This patient is critically ill and at significant risk of neurological worsening, death and care requires constant monitoring of vital signs, hemodynamics,respiratory and cardiac monitoring, extensive review of multiple databases, frequent neurological assessment, discussion with family, other specialists and medical decision making of high complexity.I have made any additions or clarifications directly to the above note.This critical care time does not reflect procedure time, or teaching time or supervisory time of PA/NP/Med Resident etc but could involve care discussion time.  I spent 30 minutes of neurocritical care time  in the care of  this patient.     Eather Popp, MD Medical Director [facility] Pager: [phone] 05/29/2024 2:54 PM   To contact Stroke Continuity provider, please refer to [email or web address]. After hours, contact General Neurology

## 2024-05-29 NOTE — Progress Notes (Signed)
 OT Cancellation Note  Patient Details Name: NYIA TSAO MRN: [id number] DOB: [birth year]   Cancelled Treatment:    Reason Eval/Treat Not Completed: Active bedrest order  Tomi Grandpre K, OTD, OTR/L [employer] Preferred Acute Rehab [phone]   Laneta MARLA Pereyra 05/29/2024, 7:20 AM

## 2024-05-30 ENCOUNTER — Ambulatory Visit: Admitting: Physician Assistant

## 2024-05-30 ENCOUNTER — Telehealth (HOSPITAL_COMMUNITY): Payer: Self-pay

## 2024-05-30 ENCOUNTER — Other Ambulatory Visit (HOSPITAL_COMMUNITY): Payer: Self-pay | Admitting: Neurology

## 2024-05-30 ENCOUNTER — Other Ambulatory Visit (HOSPITAL_COMMUNITY): Payer: Self-pay

## 2024-05-30 DIAGNOSIS — I639 Cerebral infarction, unspecified: Secondary | ICD-10-CM | POA: Diagnosis not present

## 2024-05-30 DIAGNOSIS — E7800 Pure hypercholesterolemia, unspecified: Secondary | ICD-10-CM

## 2024-05-30 DIAGNOSIS — R29700 NIHSS score 0: Secondary | ICD-10-CM | POA: Diagnosis not present

## 2024-05-30 LAB — BASIC METABOLIC PANEL WITH GFR
Anion gap: 9 (ref 5–15)
BUN: 15 mg/dL (ref 8–23)
CO2: 30 mmol/L (ref 22–32)
Calcium: 8.8 mg/dL — ABNORMAL LOW (ref 8.9–10.3)
Chloride: 101 mmol/L (ref 98–111)
Creatinine, Ser: 1.14 mg/dL — ABNORMAL HIGH (ref 0.44–1.00)
GFR, Estimated: 47 mL/min — ABNORMAL LOW (ref >60)
Glucose, Bld: 96 mg/dL (ref 70–99)
Potassium: 4.4 mmol/L (ref 3.5–5.1)
Sodium: 140 mmol/L (ref 135–145)

## 2024-05-30 LAB — CBC
HCT: 36.1 % (ref 36.0–46.0)
Hemoglobin: 11.9 g/dL — ABNORMAL LOW (ref 12.0–15.0)
MCH: 31.1 pg (ref 26.0–34.0)
MCHC: 33 g/dL (ref 30.0–36.0)
MCV: 94.3 fL (ref 80.0–100.0)
Platelets: 311 K/uL (ref 150–400)
RBC: 3.83 MIL/uL — ABNORMAL LOW (ref 3.87–5.11)
RDW: 13.3 % (ref 11.5–15.5)
WBC: 8.3 K/uL (ref 4.0–10.5)
nRBC: 0 % (ref 0.0–0.2)

## 2024-05-30 MED ORDER — CALCIUM CARBONATE ANTACID 500 MG PO CHEW
1.0000 | CHEWABLE_TABLET | Freq: Two times a day (BID) | ORAL | 0 refills | Status: AC | PRN
  Filled 2024-05-30: qty 30, 10d supply, fill #0

## 2024-05-30 MED ORDER — CLOPIDOGREL BISULFATE 75 MG PO TABS
75.0000 mg | ORAL_TABLET | Freq: Every day | ORAL | 0 refills | Status: DC
  Filled 2024-05-30: qty 21, 21d supply, fill #0

## 2024-05-30 MED ORDER — ASPIRIN 81 MG PO CHEW
81.0000 mg | CHEWABLE_TABLET | Freq: Every day | ORAL | Status: DC
  Administered 2024-05-30: 81 mg via ORAL
  Filled 2024-05-30: qty 1

## 2024-05-30 MED ORDER — CENTRUM SILVER PO TABS
1.0000 | ORAL_TABLET | Freq: Every day | ORAL | 0 refills | Status: AC
  Filled 2024-05-30: qty 30, 30d supply, fill #0

## 2024-05-30 MED ORDER — CLOPIDOGREL BISULFATE 75 MG PO TABS
75.0000 mg | ORAL_TABLET | Freq: Every day | ORAL | Status: DC
  Administered 2024-05-30: 75 mg via ORAL
  Filled 2024-05-30: qty 1

## 2024-05-30 MED ORDER — ASPIRIN 81 MG PO CHEW
81.0000 mg | CHEWABLE_TABLET | Freq: Every day | ORAL | 12 refills | Status: DC
  Filled 2024-05-30: qty 30, 30d supply, fill #0

## 2024-05-30 NOTE — Progress Notes (Addendum)
 0730 patient alert x4 on room air able to make all needs known ambulates to bathroom with walker, daughter at bedside  1315 patient discharged home daughter transporting patient.

## 2024-05-30 NOTE — Discharge Instructions (Addendum)
 Sara Jackson, you were admitted with acute onset aphasia and left sided weakness and were given TNK to treat a stroke.  Your symptoms improved, and no stroke was seen on MRI.  Your stroke was likely stopped by TNK.  You will need to follow up with your primary care provider within a week and see your cardiologist as soon as you can to restart your blood pressure medications.  Please be seen in the stroke clinic in 8 weeks.  You will need to take aspirin  and plavix  for three weeks and then aspirin  indefinitely.  Please stop taking your estradiol and discuss alternatives with your gynecologist.

## 2024-05-30 NOTE — Plan of Care (Signed)

## 2024-05-30 NOTE — Discharge Summary (Addendum)
 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   Hypertensive crisis 04/27/2024   Dizziness 04/27/2024   Esophageal dysphagia 07/28/2023   Problems with swallowing and mastication    Hiatal hernia    Stricture and stenosis of esophagus    Yeast vaginitis 07/09/2015   AP (abdominal pain) 07/04/2015   Vaginal atrophy 07/04/2015   Bladder pain 07/04/2015   Cystocele, midline 07/04/2015   IC (interstitial cystitis) 07/04/2015   Pain in thoracic spine 02/21/2015   Impingement syndrome of shoulder 10/18/2014   Benign paroxysmal positional nystagmus 07/11/2014   Essential (primary) hypertension 07/11/2014   Gastro-esophageal reflux disease without esophagitis 07/11/2014   Glaucoma 07/11/2014   Hypercholesteremia 07/11/2014   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   Low back pain 05/02/2014   Bursitis, trochanteric 05/02/2014   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     Allergies as of 05/30/2024       Reactions   Azo [phenazopyridine] Itching   Colesevelam Diarrhea   Colesevelam Hcl Diarrhea   Doxycycline Hyclate Other (See Comments)   Other Reaction: OTHER REACTION   Ezetimibe Diarrhea   Fluoxetine Other (See Comments)   Other Reaction: INSOMNIA   Other Other (See Comments)   Other Reaction: GI UPSET   Statins Diarrhea   Ace Inhibitors Rash   Other Reaction: INSOMNIA   Doxycycline Rash   Other Reaction: OTHER REACTION     Nsaids Rash   Other Reaction: GI UPSET   Risedronate Sodium Rash   Other Reaction: OTHER REACTION          Medication List      STOP taking these medications    estradiol 1 MG tablet Commonly known as: ESTRACE       TAKE these medications    acetaminophen  325 MG tablet Commonly known as: TYLENOL  Take 325 mg by mouth every 6 (six) hours as needed.   aspirin  81 MG chewable tablet Chew 1 tablet (81 mg total) by mouth daily. Start taking on: May 31, 2024   Calcium  600 600 MG Tabs tablet Generic drug: calcium  carbonate Take 600 mg by mouth daily with breakfast.   calcium  carbonate 500 MG chewable tablet Commonly known as: TUMS - dosed in mg elemental calcium  Chew 1 tablet (200 mg of elemental calcium  total) by mouth 3 times/day as needed-between meals & bedtime for indigestion or heartburn.   Centrum Silver  tablet Take 1 tablet by mouth daily. What changed: how much to take   clopidogrel  75 MG tablet Commonly known as: PLAVIX  Take 1 tablet (75 mg total) by mouth daily. Start taking on: May 31, 2024   latanoprost  0.005 % ophthalmic solution Commonly known as: XALATAN  Place 1 drop into both eyes at bedtime.   meclizine  25 MG tablet Commonly known as: ANTIVERT  Take 25 mg by mouth 3 (three) times daily as needed for dizziness.   pantoprazole  40 MG tablet Commonly known as: PROTONIX  Take 40 mg by mouth 2 (two) times daily. What changed: when to take this   Vitamin D3 50 MCG (2000 UT) capsule Take 2,000  Units by mouth daily.        LABORATORY STUDIES CBC    Component Value Date/Time   WBC 8.3 05/30/2024 1027   RBC 3.83 (L) 05/30/2024 1027   HGB 11.9 (L) 05/30/2024 1027   HCT 36.1 05/30/2024 1027   PLT 311 05/30/2024 1027   MCV 94.3 05/30/2024 1027   MCH 31.1 05/30/2024 1027   MCHC 33.0 05/30/2024 1027   RDW 13.3 05/30/2024 1027   LYMPHSABS 3.0 05/28/2024 1743   MONOABS 0.5 05/28/2024 1743   EOSABS 0.1 05/28/2024 1743   BASOSABS 0.1 05/28/2024 1743   CMP    Component Value Date/Time   NA 140 05/30/2024 1027   K 4.4 05/30/2024 1027   CL 101 05/30/2024 1027   CO2 30  05/30/2024 1027   GLUCOSE 96 05/30/2024 1027   BUN 15 05/30/2024 1027   CREATININE 1.14 (H) 05/30/2024 1027   CALCIUM  8.8 (L) 05/30/2024 1027   PROT 7.7 05/28/2024 1743   ALBUMIN 4.0 05/28/2024 1743   AST 33 05/28/2024 1743   ALT 23 05/28/2024 1743   ALKPHOS 66 05/28/2024 1743   BILITOT 0.6 05/28/2024 1743   GFRNONAA 47 (L) 05/30/2024 1027   COAGS Lab Results  Component Value Date   INR 0.9 05/28/2024   Lipid Panel    Component Value Date/Time   CHOL 199 05/29/2024 0620   TRIG 80 05/29/2024 0620   HDL 80 05/29/2024 0620   CHOLHDL 2.5 05/29/2024 0620   VLDL 16 05/29/2024 0620   LDLCALC 103 (H) 05/29/2024 0620   HgbA1C  Lab Results  Component Value Date   HGBA1C 4.8 05/28/2024   Alcohol Level    Component Value Date/Time   [facility] <15 05/28/2024 1743     SIGNIFICANT DIAGNOSTIC STUDIES CT HEAD WO CONTRAST ( ) Result Date: 05/29/2024 CLINICAL DATA:  Stroke, follow up EXAM: CT HEAD WITHOUT CONTRAST TECHNIQUE: Contiguous axial images were obtained from the base of the skull through the vertex without intravenous contrast. RADIATION DOSE REDUCTION: This exam was performed according to the departmental dose-optimization program which includes automated exposure control, adjustment of the mA and/or kV according to patient size and/or use of iterative reconstruction technique. COMPARISON:  MRI head from earlier today. FINDINGS: Brain: No evidence of acute infarction, hemorrhage, hydrocephalus, extra-axial collection or mass lesion/mass effect. Partially empty sella. Vascular: No hyperdense vessel or unexpected calcification. Skull: No acute fracture. Sinuses/Orbits: Clear sinuses. No acute orbital findings. IMPRESSION: No evidence of acute intracranial abnormality. Electronically Signed   By: Gilmore GORMAN Molt M.D.   On: 05/29/2024 21:36   ECHOCARDIOGRAM COMPLETE Result Date: 05/29/2024    ECHOCARDIOGRAM REPORT   Patient Name:   Sara Jackson Date of Exam: 05/29/2024 Medical Rec  #:  [id number]           Height:       65.0 in Accession #:    [id number]          Weight:       134.5 lb Date of Birth:  1940/11/04           BSA:          1.671 m Patient Age:    82 years            BP:           134/74 mmHg Patient Gender: F                   HR:  83 bpm. Exam Location:  Inpatient Procedure: 2D Echo, Cardiac Doppler, Color Doppler and Saline Contrast Bubble            Study (Both Spectral and Color Flow Doppler were utilized during            procedure). Indications:    Stroke  History:        Patient has prior history of Echocardiogram examinations.                 Stroke; Risk Factors:Hypertension.  Sonographer:    Vella Key Referring Phys: [phone] ERIC LINDZEN  Sonographer Comments: Suboptimal parasternal window. IMPRESSIONS  1. Left ventricular ejection fraction, by estimation, is 65 to 70%. The left ventricle has normal function. The left ventricle has no regional wall motion abnormalities. Left ventricular diastolic parameters are consistent with Grade I diastolic dysfunction (impaired relaxation).  2. Right ventricular systolic function is normal. The right ventricular size is normal. There is normal pulmonary artery systolic pressure. The estimated right ventricular systolic pressure is 29.4 mmHg.  3. The mitral valve is grossly normal. Trivial mitral valve regurgitation. No evidence of mitral stenosis.  4. The aortic valve was not well visualized. Aortic valve regurgitation is not visualized. No aortic stenosis is present.  5. The inferior vena cava is normal in size with greater than 50% respiratory variability, suggesting right atrial pressure of 3 mmHg.  6. Agitated saline contrast bubble study was negative, with no evidence of any interatrial shunt. FINDINGS  Left Ventricle: Left ventricular ejection fraction, by estimation, is 65 to 70%. The left ventricle has normal function. The left ventricle has no regional wall motion abnormalities. The left ventricular internal cavity  size was normal in size. There is  no left ventricular hypertrophy. Left ventricular diastolic parameters are consistent with Grade I diastolic dysfunction (impaired relaxation). Right Ventricle: The right ventricular size is normal. No increase in right ventricular wall thickness. Right ventricular systolic function is normal. There is normal pulmonary artery systolic pressure. The tricuspid regurgitant velocity is 2.57 m/s, and  with an assumed right atrial pressure of 3 mmHg, the estimated right ventricular systolic pressure is 29.4 mmHg. Left Atrium: Left atrial size was normal in size. Right Atrium: Right atrial size was normal in size. Pericardium: There is no evidence of pericardial effusion. Mitral Valve: The mitral valve is grossly normal. Trivial mitral valve regurgitation. No evidence of mitral valve stenosis. Tricuspid Valve: The tricuspid valve is normal in structure. Tricuspid valve regurgitation is trivial. No evidence of tricuspid stenosis. Aortic Valve: The aortic valve was not well visualized. Aortic valve regurgitation is not visualized. No aortic stenosis is present. Pulmonic Valve: The pulmonic valve was normal in structure. Pulmonic valve regurgitation is not visualized. No evidence of pulmonic stenosis. Aorta: The aortic root is normal in size and structure. Venous: The inferior vena cava is normal in size with greater than 50% respiratory variability, suggesting right atrial pressure of 3 mmHg. IAS/Shunts: The interatrial septum was not well visualized. Agitated saline contrast was given intravenously to evaluate for intracardiac shunting. Agitated saline contrast bubble study was negative, with no evidence of any interatrial shunt.  LEFT VENTRICLE PLAX 2D LVIDd:         3.40 cm     Diastology LVIDs:         2.10 cm     LV e' medial:    6.31 cm/s LV PW:         0.90 cm     LV E/e'  medial:  12.1 LV IVS:        0.90 cm     LV e' lateral:   6.85 cm/s LVOT diam:     1.80 cm     LV E/e' lateral:  11.1 LV SV:         50 LV SV Index:   30 LVOT Area:     2.54 cm  LV Volumes (MOD) LV vol d, MOD A2C: 53.6 ml LV vol d, MOD A4C: 63.4 ml LV vol s, MOD A2C: 19.7 ml LV vol s, MOD A4C: 26.6 ml LV SV MOD A2C:     33.9 ml LV SV MOD A4C:     63.4 ml LV SV MOD BP:      35.3 ml RIGHT VENTRICLE RV Basal diam:  2.90 cm RV S prime:     15.00 cm/s TAPSE (M-mode): 1.8 cm LEFT ATRIUM             Index        RIGHT ATRIUM          Index LA diam:        2.30 cm 1.38 cm/m   RA Area:     7.07 cm LA Vol (A2C):   33.2 ml 19.87 ml/m  RA Volume:   12.20 ml 7.30 ml/m LA Vol (A4C):   22.3 ml 13.34 ml/m LA Biplane Vol: 28.9 ml 17.29 ml/m  AORTIC VALVE LVOT Vmax:   93.30 cm/s LVOT Vmean:  64.500 cm/s LVOT VTI:    0.197 m  AORTA Ao Root diam: 2.90 cm MITRAL VALVE                TRICUSPID VALVE MV Area (PHT): 3.83 cm     TR Peak grad:   26.4 mmHg MV Decel Time: 198 msec     TR Vmax:        257.00 cm/s MV E velocity: 76.30 cm/s MV A velocity: 105.00 cm/s  SHUNTS MV E/A ratio:  0.73         Systemic VTI:  0.20 m                             Systemic Diam: 1.80 cm Soyla Merck MD Electronically signed by Soyla Merck MD Signature Date/Time: 05/29/2024/1:12:18 PM    Final    MR BRAIN WO CONTRAST Result Date: 05/29/2024 CLINICAL DATA:  Follow-up stroke EXAM: MRI HEAD WITHOUT CONTRAST TECHNIQUE: Multiplanar, multiecho pulse sequences of the brain and surrounding structures were obtained without intravenous contrast. COMPARISON:  October 23, 2015 FINDINGS: MRI brain: There are several foci of T2 hyperintensity in the cerebral white matter. These do not have restricted diffusion. No acute infarct. The ventricles are normal. No mass lesion. There are normal flow signals in the carotid arteries and basilar artery. No significant bone marrow signal abnormality. No significant abnormality in the paranasal sinuses or soft tissues. IMPRESSION: No acute infarct or other significant abnormality Electronically Signed   By: Nancyann Burns M.D.    On: 05/29/2024 12:23   CT ANGIO HEAD NECK W WO CM (CODE STROKE) Result Date: 05/28/2024 CLINICAL DATA:  Neuro deficit, acute, stroke suspected. Dizziness, headache, aphasia, and left-sided weakness. EXAM: CT ANGIOGRAPHY HEAD AND NECK WITH AND WITHOUT CONTRAST TECHNIQUE: Multidetector CT imaging of the head and neck was performed using the standard protocol during bolus administration of intravenous contrast. Multiplanar CT image reconstructions and MIPs were obtained to evaluate the vascular anatomy. Carotid  stenosis measurements (when applicable) are obtained utilizing NASCET criteria, using the distal internal carotid diameter as the denominator. RADIATION DOSE REDUCTION: This exam was performed according to the departmental dose-optimization program which includes automated exposure control, adjustment of the mA and/or kV according to patient size and/or use of iterative reconstruction technique. CONTRAST:  75mL OMNIPAQUE  IOHEXOL  350 MG/ML SOLN COMPARISON:  CT chest 11/22/2018 FINDINGS: CTA NECK FINDINGS Aortic arch: Standard branching with mild atherosclerosis. Patent brachiocephalic and subclavian arteries without significant stenosis. Right carotid system: Patent with a moderate amount of predominantly soft plaque in the carotid bulb. No evidence of a significant stenosis or dissection. Left carotid system: Patent with a small amount of calcified and soft plaque at the carotid bifurcation. No evidence of a significant stenosis or dissection. Vertebral arteries: Patent without evidence of stenosis, dissection, or significant atherosclerosis. Slightly dominant right vertebral artery. Skeleton: Cervical disc degeneration, greatest at C4-5. Other neck: No evidence of cervical lymphadenopathy or mass. Upper chest: Numerous small bilateral pulmonary nodules are similar to the 2020 chest CT and most compatible with a benign etiology. Review of the MIP images confirms the above findings CTA HEAD FINDINGS Anterior  circulation: The internal carotid arteries are patent from skull base to carotid termini with mild atherosclerosis bilaterally not resulting in a significant stenosis. ACAs and MCAs are patent with mild branch vessel irregularity but no evidence of a proximal branch occlusion or significant proximal stenosis. No aneurysm is identified. Posterior circulation: The intracranial vertebral arteries are widely patent to the basilar. Patent right PICA, left AICA, and bilateral SCA origins are visualized. The basilar artery is widely patent. Posterior communicating arteries are diminutive or absent. Both PCAs are patent, and there are moderate proximal left P2 and severe mid right P2 stenoses. No aneurysm is identified. Venous sinuses: As permitted by contrast timing, patent. Anatomic variants: None. Review of the MIP images confirms the above findings These results were communicated to Dr. Lindzen at 6:10 pm on 05/28/2024 by text page via the [employer] messaging system. IMPRESSION: 1. No large vessel occlusion. 2. 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 3. Cervical carotid atherosclerosis without significant stenosis. 4.  Aortic Atherosclerosis (ICD10-I70.0). Electronically Signed   By: Dasie Hamburg M.D.   On: 05/28/2024 18:33   CT HEAD CODE STROKE WO CONTRAST Result Date: 05/28/2024 CLINICAL DATA:  Code stroke. Neuro deficit, acute, stroke suspected. Dizziness, headache, aphasia, and left-sided weakness. EXAM: CT HEAD WITHOUT CONTRAST TECHNIQUE: Contiguous axial images were obtained from the base of the skull through the vertex without intravenous contrast. RADIATION DOSE REDUCTION: This exam was performed according to the departmental dose-optimization program which includes automated exposure control, adjustment of the mA and/or kV according to patient size and/or use of iterative reconstruction technique. COMPARISON:  Head CT 04/27/2024 FINDINGS: Brain: There is no evidence of  an acute infarct, intracranial hemorrhage, mass, midline shift, or extra-axial fluid collection. Cerebral volume is normal for age. The ventricles are normal in size. Vascular: Calcified atherosclerosis at the skull base. No hyperdense vessel. Skull: No fracture or suspicious lesion. Sinuses/Orbits: The included paranasal sinuses and mastoid air cells are well aerated. Bilateral cataract extraction. Other: None. ASPECTS ([place] Stroke Program Early CT Score) - Ganglionic level infarction (caudate, lentiform nuclei, internal capsule, insula, M1-M3 cortex): 7 - Supraganglionic infarction (M4-M6 cortex): 3 Total score (0-10 with 10 being normal): 10 These results were communicated to Dr. Lindzen at 5:59 pm on 05/28/2024 by text page via the [facility] messaging system. IMPRESSION: No evidence  of acute intracranial abnormality. ASPECTS of 10. Electronically Signed   By: Dasie Hamburg M.D.   On: 05/28/2024 18:10   VAS US  CAROTID Result Date: 05/23/2024 Carotid Arterial Duplex Study Patient Name:  ARIELYS WANDERSEE  Date of Exam:   05/23/2024 Medical Rec #: [id number]            Accession #:    [id number] Date of Birth: [birth year]            Patient Gender: F Patient Age:   69 years Exam Location:  [address] Procedure:      VAS US  CAROTID Referring Phys: BARNIE HILA --------------------------------------------------------------------------------  Indications:       Carotid artery disease and Dizziness with position change. Risk Factors:      Hypertension, no history of smoking. Other Factors:     Systolic BP drops when patient stands. Comparison Study:  04/27/24 CT Head - atherosclerotic calcifications within the                    cavernous ICAs.                    10/23/15 US  Carotid Performing Technologist: Norleen Buck RVT, RDMS  Examination Guidelines: A complete evaluation includes B-mode imaging, spectral Doppler, color Doppler, and power Doppler as needed of all accessible portions of each vessel. Bilateral  testing is considered an integral part of a complete examination. Limited examinations for reoccurring indications may be performed as noted.  Right Carotid Findings: +----------+--------+--------+--------+------------------+--------+           PSV cm/sEDV cm/sStenosisPlaque DescriptionComments +----------+--------+--------+--------+------------------+--------+ CCA Prox  91      15                                         +----------+--------+--------+--------+------------------+--------+ CCA Distal90      16              heterogenous               +----------+--------+--------+--------+------------------+--------+ ICA Prox  104     21      1-39%   heterogenous               +----------+--------+--------+--------+------------------+--------+ ICA Mid   107     26                                         +----------+--------+--------+--------+------------------+--------+ ICA Distal101     29                                         +----------+--------+--------+--------+------------------+--------+ ECA       96      9                                          +----------+--------+--------+--------+------------------+--------+ +----------+--------+-------+----------------+-------------------+           PSV cm/sEDV cmsDescribe        Arm Pressure (mmHG) +----------+--------+-------+----------------+-------------------+ [id number]     0      Multiphasic, [id number]                 +----------+--------+-------+----------------+-------------------+ +---------+--------+--+--------+--+---------+  VertebralPSV cm/s57EDV cm/s16Antegrade +---------+--------+--+--------+--+---------+  Left Carotid Findings: +----------+--------+--------+--------+------------------+--------+           PSV cm/sEDV cm/sStenosisPlaque DescriptionComments +----------+--------+--------+--------+------------------+--------+ CCA Prox  83      16                                          +----------+--------+--------+--------+------------------+--------+ CCA Distal81      18                                         +----------+--------+--------+--------+------------------+--------+ ICA Prox  94      20      1-39%   heterogenous               +----------+--------+--------+--------+------------------+--------+ ICA Mid   98      24                                         +----------+--------+--------+--------+------------------+--------+ ICA Distal119     29                                         +----------+--------+--------+--------+------------------+--------+ ECA       71      7                                          +----------+--------+--------+--------+------------------+--------+ +----------+--------+--------+----------------+-------------------+           PSV cm/sEDV cm/sDescribe        Arm Pressure (mmHG) +----------+--------+--------+----------------+-------------------+ Subclavian135     0       Multiphasic, [id number]                 +----------+--------+--------+----------------+-------------------+ +---------+--------+--+--------+--+---------+ VertebralPSV cm/s54EDV cm/s13Antegrade +---------+--------+--+--------+--+---------+   Summary: Right Carotid: Velocities in the right ICA are consistent with a 1-39% stenosis.                The extracranial vessels were near-normal with only minimal wall                thickening or plaque. Left Carotid: Velocities in the left ICA are consistent with a 1-39% stenosis.               The extracranial vessels were near-normal with only minimal wall               thickening or plaque. Vertebrals:  Bilateral vertebral arteries demonstrate antegrade flow. Subclavians: Normal flow hemodynamics were seen in bilateral subclavian              arteries. *See table(s) above for measurements and observations.  Electronically signed by Gordy Bergamo MD on 05/23/2024 at 2:21:35 PM.    Final        HISTORY  OF PRESENT ILLNESS 83 y.o. left-handed patient with history of hypertension, chronic diarrhea, BPPV, hyperlipidemia and GERD was admitted with headache, aphasia and left-sided weakness.  HOSPITAL COURSE Patient was given TNK to treat stroke.  Her symptoms then improved.  However, no acute infarct was seen on MRI, and  stroke was likely aborted by TNK.  Patient is now stable and ready to be discharged.  She will need to see her primary care provider and see her cardiologist as soon as possible to resume her antihypertensives.  Will discontinue estradiol and encourage patient to seek alternatives from gynecologist.  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 ASPECTS 10.    CTA head & neck no LVO, intracranial atherosclerosis including severe right and moderate left P2 stenoses MRI negative for acute infarct 24-hour follow-up CT pending 2D Echo EF 65 to 70%, grade 1 diastolic dysfunction, no interatrial shunt, normal left atrial size LDL 103 HgbA1c 4.8 VTE prophylaxis -SCDs No antithrombotic prior to admission, now on aspirin  81 mg and Plavix  75 mg daily into 3 weeks and then aspirin  alone Therapy recommendations: No follow-up needed Disposition: Home   Hypertension Home meds: None, medications recently stopped by cardiologist Stable Blood Pressure Goal: BP less than 180/105    Hyperlipidemia Home meds: None, intolerant to statins and ezetimibe LDL 103, goal < 70 High intensity statin not indicated due to previous intolerance Will attempt to establish up patient therapy with Leqvio or PCSK9 inhibitor   Other Stroke Risk Factors None     Other Active Problems None  RN Pressure Injury Documentation:     DISCHARGE EXAM  PHYSICAL EXAM General:  Alert, well-nourished, well-developed patient in no acute distress Psych:  Mood and affect appropriate for situation CV: Regular rate and rhythm on monitor Respiratory:  Regular,  unlabored respirations on room air GI: Abdomen soft and nontender  NEURO:  Mental Status: AA&Ox3  Speech/Language: speech is without dysarthria or aphasia.    Cranial Nerves:  II: PERRL. Visual fields full.  III, IV, VI: EOMI. Eyelids elevate symmetrically.  V: Sensation is intact to light touch and symmetrical to face.  VII: Smile is symmetrical.  VIII: hearing intact to voice. IX, X: Phonation is normal.  KP:Dynloizm shrug 5/5. XII: tongue is midline without fasciculations. Motor: 5/5 strength to all muscle groups tested.  Tone: is normal and bulk is normal Sensation- Intact to light touch bilaterally.  Coordination: FTN intact bilaterally Gait- deferred  1a Level of Conscious.: 0 1b LOC Questions: 0 1c LOC Commands: 0 2 Best Gaze: 0 3 Visual: 0 4 Facial Palsy: 0 5a Motor Arm - left: 0 5b Motor Arm - Right: 0 6a Motor Leg - Left: 0 6b Motor Leg - Right: 0 7 Limb Ataxia: 0 8 Sensory: 0 9 Best Language: 0 10 Dysarthria: 0 11 Extinct. and Inatten.: 0 TOTAL: 0   Discharge Diet       Diet   Diet regular Room service appropriate? Yes; Fluid consistency: Thin   liquids  DISCHARGE PLAN Disposition: Home aspirin  81 mg daily and clopidogrel  75 mg daily for secondary stroke prevention for 3 weeks then aspirin  81 mg daily alone. Ongoing stroke risk factor control by Primary Care Physician at time of discharge Follow-up PCP Pcp, No in 2 weeks. Follow up with cardiologist to resume antihypertensive therapy as soon as possible Follow-up in [facility] in 8 weeks, office to schedule an appointment.   35 minutes were spent preparing discharge.  Cortney E Everitt Clint Kill , MSN, AGACNP-BC [facility] See Amion for schedule and pager information 05/30/2024 12:09 PM  I have personally obtained history,examined this patient, reviewed notes, independently viewed imaging studies, participated in medical decision making and plan of care.ROS  completed by me  personally and pertinent positives fully documented  I have made any additions or clarifications directly to the above note. Agree with note above.    Eather Popp, MD Medical Director [facility] Pager: [phone] 05/30/2024 4:38 PM

## 2024-05-30 NOTE — Progress Notes (Signed)
 Physical Therapy Treatment Patient Details Name: Sara Jackson MRN: [id number] DOB: 11-Jul-1941 Today's Date: 05/30/2024   History of Present Illness 83 yo female presenting to ED 7/27 with aphasia, headache, and L sided weakness. S/p TNK. MRI negative. NIHSS improved 11 to 4. PMH includes HTN, HLD, GERD, BPPV with multiple recent ED presentations for dizziness    PT Comments  Patient overall moving at supervision to modified independent level. She required cues for safest use of rollator during transfers (sit to stand and stand to sit). Her gait pattern was much improved with use of rollator (familiar to pt; she uses at home) with no imbalance or drifting noted. Excellent progress towards goals. Anticipate will discharge home this afternoon (per RN).     If plan is discharge home, recommend the following: A little help with walking and/or transfers;Assistance with cooking/housework;Assist for transportation;Help with stairs or ramp for entrance   Can travel by private vehicle        Equipment Recommendations  None recommended by PT    Recommendations for Other Services       Precautions / Restrictions Precautions Precautions: Fall Recall of Precautions/Restrictions: Intact Precaution/Restrictions Comments: denies falls Restrictions Weight Bearing Restrictions Per Provider Order: No     Mobility  Bed Mobility Overal bed mobility: Independent                  Transfers Overall transfer level: Needs assistance Equipment used: Rolling walker (2 wheels) Transfers: Sit to/from Stand Sit to Stand: Supervision           General transfer comment: vc for locking brakes during transfers    Ambulation/Gait Ambulation/Gait assistance: Supervision, Modified independent (Device/Increase time) Gait Distance (Feet): 150 Feet Assistive device: Rollator (4 wheels) Gait Pattern/deviations: WFL(Within Functional Limits)   Gait velocity interpretation: 1.31 - 2.62 ft/sec,  indicative of limited community ambulator   General Gait Details: improved gait mechanics with use of rollator (which she uses at home); no imbalance or drifting   Stairs             Wheelchair Mobility     Tilt Bed    Modified Rankin (Stroke Patients Only) Modified Rankin (Stroke Patients Only) Pre-Morbid Rankin Score: No significant disability Modified Rankin: No significant disability     Balance Overall balance assessment: Needs assistance Sitting-balance support: No upper extremity supported, Feet unsupported Sitting balance-Leahy Scale: Good     Standing balance support: Bilateral upper extremity supported Standing balance-Leahy Scale: Poor                              Communication Communication Communication: No apparent difficulties  Cognition Arousal: Alert Behavior During Therapy: WFL for tasks assessed/performed   PT - Cognitive impairments: History of cognitive impairments                       PT - Cognition Comments: see SLP evaluation; pt likely at baseline Following commands: Intact      Cueing Cueing Techniques: Verbal cues  Exercises      General Comments General comments (skin integrity, edema, etc.): Daughter present.      Pertinent Vitals/Pain      Home Living                          Prior Function            PT Goals (current goals can  now be found in the care plan section) Acute Rehab PT Goals Patient Stated Goal: return home PT Goal Formulation: With patient Time For Goal Achievement: 06/12/24 Potential to Achieve Goals: Good Progress towards PT goals: Progressing toward goals    Frequency           PT Plan      Co-evaluation              AM-PAC PT 6 Clicks Mobility   Outcome Measure  Help needed turning from your back to your side while in a flat bed without using bedrails?: None Help needed moving from lying on your back to sitting on the side of a flat bed  without using bedrails?: None Help needed moving to and from a bed to a chair (including a wheelchair)?: A Little Help needed standing up from a chair using your arms (e.g., wheelchair or bedside chair)?: A Little Help needed to walk in hospital room?: A Little Help needed climbing 3-5 steps with a railing? : A Little 6 Click Score: 20    End of Session   Activity Tolerance: Patient tolerated treatment well Patient left: in bed;with call bell/phone within reach;with bed alarm set;with family/visitor present Nurse Communication: Mobility status PT Visit Diagnosis: Unsteadiness on feet (R26.81);Dizziness and giddiness (R42)     Time: 8885-8875 PT Time Calculation (min) (ACUTE ONLY): 10 min  Charges:    $Gait Training: 8-22 mins PT General Charges $$ ACUTE PT VISIT: 1 Visit                      [name], PT Acute Rehabilitation Services  Office [phone]    [name] 05/30/2024, 11:40 AM

## 2024-06-01 ENCOUNTER — Ambulatory Visit: Admitting: Physician Assistant

## 2024-06-01 NOTE — Telephone Encounter (Signed)
 Spoke to patient, may be already scheduled with [facility] cardio, patient will call back and confirm. Amyers

## 2024-06-02 ENCOUNTER — Ambulatory Visit: Attending: Medical | Admitting: Medical

## 2024-06-02 ENCOUNTER — Encounter: Payer: Self-pay | Admitting: Medical

## 2024-06-02 VITALS — BP 158/80 | HR 80 | Ht 65.0 in | Wt 135.8 lb

## 2024-06-02 DIAGNOSIS — I6389 Other cerebral infarction: Secondary | ICD-10-CM | POA: Insufficient documentation

## 2024-06-02 DIAGNOSIS — I951 Orthostatic hypotension: Secondary | ICD-10-CM | POA: Insufficient documentation

## 2024-06-02 DIAGNOSIS — I10 Essential (primary) hypertension: Secondary | ICD-10-CM | POA: Diagnosis present

## 2024-06-02 DIAGNOSIS — R42 Dizziness and giddiness: Secondary | ICD-10-CM | POA: Diagnosis present

## 2024-06-02 DIAGNOSIS — E782 Mixed hyperlipidemia: Secondary | ICD-10-CM | POA: Diagnosis present

## 2024-06-02 MED ORDER — LISINOPRIL 5 MG PO TABS: 5.0000 mg | ORAL_TABLET | Freq: Two times a day (BID) | ORAL | 3 refills | Status: AC

## 2024-06-02 NOTE — Progress Notes (Signed)
 Cardiology Office Note   Date:  06/02/2024  ID:  Sara Jackson, DOB 1941/10/25, MRN [id number] PCP: Pcp, No  [facility] Providers Cardiologist:  Evalene Lunger, MD     History of Present Illness Sara Jackson is a 83 y.o. female with a history of hypertension, GERD, presyncope, and recent stroke who presents for hospital follow-up.  Patient saw Dr. Gollan in 2023 for presyncope and chest discomfort.  Orthostatics were negative.  Echo showed normal LV and RV function with grade 1 diastolic dysfunction, no significant valvular abnormalities.  Heart monitor showed 5 runs of SVT/A. tach.  Patient went to the urgent care 04/27/2024 with dizziness, headache, hypertension found to have blood pressure of 183/95 and was sent to the ER.  Head CT showed no acute abnormality.  Potassium was 3.1 and she was given oral potassium replacement.  High-sensitivity troponin 18, 20.  It was felt dizziness was additional vertigo and was started on meclizine .  Patient was in the ER at [facility] 04/30/2024 with dizziness.  Orthostatics were positive.  It was recommended she increase hydration.  Patient was seen in follow-up 05/11/2024 and was severely orthostatic.  Lisinopril was discontinued and carotid ultrasound was ordered.  This showed 1 to 39% bilateral disease.  Patient was admitted in late July 2025 with headache, aphasia and left-sided weakness.  Initially felt it was stroke, but TNK was aborted with imaging was negative. 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  No acute infarct was seen on MRI.  Echo showed EF 65 to 70%, grade 1 diastolic dysfunction.  She was started on aspirin  and Plavix .  Today, patient is overall doing okay.  She is back at home and slowly regaining function.  She reports elevated blood pressures and persistent headache.  She still has intermittent dizziness.  Patient has no further weakness or speech  issues since the stroke.  Carotid ultrasound was reviewed.  Patient denies chest pain, shortness of breath, palpitations, heart racing, lower leg edema.  Studies Reviewed     Echo 05/2024 1. Left ventricular ejection fraction, by estimation, is 65 to 70%. The  left ventricle has normal function. The left ventricle has no regional  wall motion abnormalities. Left ventricular diastolic parameters are  consistent with Grade I diastolic  dysfunction (impaired relaxation).   2. Right ventricular systolic function is normal. The right ventricular  size is normal. There is normal pulmonary artery systolic pressure. The  estimated right ventricular systolic pressure is 29.4 mmHg.   3. The mitral valve is grossly normal. Trivial mitral valve  regurgitation. No evidence of mitral stenosis.   4. The aortic valve was not well visualized. Aortic valve regurgitation  is not visualized. No aortic stenosis is present.   5. The inferior vena cava is normal in size with greater than 50%  respiratory variability, suggesting right atrial pressure of 3 mmHg.   6. Agitated saline contrast bubble study was negative, with no evidence  of any interatrial shunt.    Carotid ultrasound 05/2024  Summary:  Right Carotid: Velocities in the right ICA are consistent with a 1-39%  stenosis.                The extracranial vessels were near-normal with only minimal  wall                thickening or plaque.   Left Carotid: Velocities in the left ICA are consistent with a 1-39%  stenosis.               The extracranial vessels were near-normal with only minimal  wall               thickening or plaque.   Vertebrals:  Bilateral vertebral arteries demonstrate antegrade flow.  Subclavians: Normal flow hemodynamics were seen in bilateral subclavian               arteries.   *See table(s) above for measurements and observations.  HYPERTENSION CONTROL Vitals:   06/02/24 1448 06/02/24 1453  BP: (!) 168/82 (!)  158/80    The patient's blood pressure is elevated above target today.  In order to address the patient's elevated BP: A new medication was prescribed today.          Physical Exam VS:  BP (!) 158/80   Pulse 80   Ht 5' 5 (1.651 m)   Wt 135 lb 12.8 oz (61.6 kg)   SpO2 95%   BMI 22.60 kg/m        Wt Readings from Last 3 Encounters:  06/02/24 135 lb 12.8 oz (61.6 kg)  05/28/24 134 lb 7.7 oz (61 kg)  05/11/24 130 lb 12.8 oz (59.3 kg)    GEN: Well nourished, well developed in no acute distress NECK: No JVD; No carotid bruits CARDIAC: RRR, no murmurs, rubs, gallops RESPIRATORY:  Clear to auscultation without rales, wheezing or rhonchi  ABDOMEN: Soft, non-tender, non-distended EXTREMITIES:  No edema; No deformity   ASSESSMENT AND PLAN  Recent stroke Patient was recently admitted for stroke aborted by TKN. She presented with leg weakness and aphasia.  Imaging of the head showed no evidence of stroke.  Echo showed normal pump function, no interatrial shunt, grade 1 diastolic dysfunction, normal left atrial size.  Patient was discharged on aspirin  and Plavix  x 3 weeks, then aspirin  alone.  She has no residual weakness or speech deficits.  She is doing physical therapy.  Recent carotid ultrasound showed 1 to 39% bilateral disease.  Continue aspirin  and Plavix .  I will order a 30-day heart monitor to evaluate for atrial fibrillation.  Dizziness Hypertension History of orthostatic hypotension Patient has a long history of dizziness and orthostasis.  At the last visit she was found to be severely orthostatic and lisinopril-HCTZ was discontinued.  She reports elevated blood pressures accompanied by a headache.  Blood pressure today is 168/82.  I will start lisinopril 5 mg twice a day as to avoid orthostasis.  She will continue to monitor blood pressure at home.  Hyperlipidemia LDL 103.  She was started on Leqvio.       Dispo: Follow-up in 2 months  Signed, Emmersyn Kratzke VEAR Fishman, PA-C

## 2024-06-02 NOTE — Patient Instructions (Addendum)
 Medication Instructions:  Your physician recommends the following medication changes.  START TAKING: Lisinopril 5 mg by mouth twice a day  *If you need a refill on your cardiac medications before your next appointment, please call your pharmacy*  Lab Work: No labs ordered today    Testing/Procedures: [employer] Cardiac Event Monitor Instructions  Your physician has requested you wear your cardiac event monitor for _____ days, (1-30). [employer] may call or text to confirm a shipping address. The monitor will be sent to a land address via UPS. [employer] will not ship a monitor to a PO BOX. It typically takes 3-5 days to receive your monitor after it has been enrolled. [employer] will assist with USPS tracking if your package is delayed. The telephone number for [employer] is [phone]. Once you have received your monitor, please review the enclosed instructions. Instruction tutorials can also be viewed under help and settings on the enclosed cell phone. Your monitor has already been registered assigning a specific monitor serial # to you.  Billing and Self Pay Discount Information  [employer] has been provided the insurance information we had on file for you.  If your insurance has been updated, please call [employer] at [phone] to provide them with your updated insurance information.   [employer] offers a discounted Self Pay option for patients who have insurance that does not cover their cardiac event monitor or patients without insurance.  The discounted cost of a Self Pay Cardiac Event Monitor would be $225.00 , if the patient contacts [employer] at [phone] within 7 days of applying the monitor to make payment arrangements.  If the patient does not contact [employer] within 7 days of applying the monitor, the cost of the cardiac event monitor will be $350.00.  Applying the monitor  Remove cell phone from case and turn it on. The cell phone works as IT consultant  and needs to be within [employer] of you at all times. The cell phone will need to be charged on a daily basis. We recommend you plug the cell phone into the enclosed charger at your bedside table every night.  Monitor batteries: You will receive two monitor batteries labelled #1 and #2. These are your recorders. Plug battery #2 onto the second connection on the enclosed charger. Keep one battery on the charger at all times. This will keep the monitor battery deactivated. It will also keep it fully charged for when you need to switch your monitor batteries. A small light will be blinking on the battery emblem when it is charging. The light on the battery emblem will remain on when the battery is fully charged.  Open package of a Monitor strip. Insert battery #1 into black hood on strip and gently squeeze monitor battery onto connection as indicated in instruction booklet. Set aside while preparing skin.  Choose location for your strip, vertical or horizontal, as indicated in the instruction booklet. Shave to remove all hair from location. There cannot be any lotions, oils, powders, or colognes on skin where monitor is to be applied. Wipe skin clean with enclosed Saline wipe. Dry skin completely.  Peel paper labeled #1 off the back of the Monitor strip exposing the adhesive. Place the monitor on the chest in the vertical or horizontal position shown in the instruction booklet. One arrow on the monitor strip must be pointing upward. Carefully remove paper labeled #2, attaching remainder of strip to your skin. Try not to create any folds or wrinkles in the strip as you apply it.  Firmly press and release the circle in the center of the monitor battery. You will hear a small beep. This is turning the monitor battery on. The heart emblem on the monitor battery will light up every 5 seconds if the monitor battery in turned on and connected to the patient securely. Do not push and hold the circle down  as this turns the monitor battery off. The cell phone will locate the monitor battery. A screen will appear on the cell phone checking the connection of your monitor strip. This may read poor connection initially but change to good connection within the next minute. Once your monitor accepts the connection you will hear a series of 3 beeps followed by a climbing crescendo of beeps. A screen will appear on the cell phone showing the two monitor strip placement options. Touch the picture that demonstrates where you applied the monitor strip.  Your monitor strip and battery are waterproof. You are able to shower, bathe, or swim with the monitor on. They just ask you do not submerge deeper than 3 feet underwater. We recommend removing the monitor if you are swimming in a lake, river, or ocean.  Your monitor battery will need to be switched to a fully charged monitor battery approximately once a week. The cell phone will alert you of an action which needs to be made.  On the cell phone, tap for details to reveal connection status, monitor battery status, and cell phone battery status. The green dots indicates your monitor is in good status. A red dot indicates there is something that needs your attention.  To record a symptom, click the circle on the monitor battery. In 30-60 seconds a list of symptoms will appear on the cell phone. Select your symptom and tap save. Your monitor will record a sustained or significant arrhythmia regardless of you clicking the button. Some patients do not feel the heart rhythm irregularities. [employer] will notify us  of any serious or critical events.  Refer to instruction booklet for instructions on switching batteries, changing strips, the Do not disturb or Pause features, or any additional questions.  Call [employer] at [phone], to confirm your monitor is transmitting and record your baseline. They will answer any questions you may have regarding the  monitor instructions at that time.  Returning the monitor to [employer]  Place all equipment back into blue box. Peel off strip of paper to expose adhesive and close box securely. There is a prepaid UPS shipping label on this box. Drop in a UPS drop box, or at a UPS facility like [employer]. You may also contact [employer] to arrange UPS to pick up monitor package at your home.   Follow-Up: At [facility], you and your health needs are our priority.  As part of our continuing mission to provide you with exceptional heart care, our providers are all part of one team.  This team includes your primary Cardiologist (physician) and Advanced Practice Providers or APPs (Physician Assistants and Nurse Practitioners) who all work together to provide you with the care you need, when you need it.  Your next appointment:   2 month(s)  Provider:   Timothy Gollan, MD or Cadence Franchester, PA-C

## 2024-06-05 ENCOUNTER — Other Ambulatory Visit: Payer: Self-pay | Admitting: Medical

## 2024-06-05 DIAGNOSIS — I6389 Other cerebral infarction: Secondary | ICD-10-CM

## 2024-06-05 DIAGNOSIS — R42 Dizziness and giddiness: Secondary | ICD-10-CM

## 2024-06-05 DIAGNOSIS — E782 Mixed hyperlipidemia: Secondary | ICD-10-CM

## 2024-06-05 DIAGNOSIS — I951 Orthostatic hypotension: Secondary | ICD-10-CM

## 2024-06-05 DIAGNOSIS — I639 Cerebral infarction, unspecified: Secondary | ICD-10-CM

## 2024-06-05 DIAGNOSIS — I10 Essential (primary) hypertension: Secondary | ICD-10-CM

## 2024-06-07 ENCOUNTER — Telehealth: Payer: Self-pay

## 2024-06-07 ENCOUNTER — Telehealth: Payer: Self-pay | Admitting: *Deleted

## 2024-06-07 ENCOUNTER — Ambulatory Visit
Admission: RE | Admit: 2024-06-07 | Discharge: 2024-06-07 | Disposition: A | Discharge status: Home / Self Care | Source: Ambulatory Visit | Attending: Neurology | Admitting: Neurology

## 2024-06-07 DIAGNOSIS — E7800 Pure hypercholesterolemia, unspecified: Secondary | ICD-10-CM | POA: Diagnosis present

## 2024-06-07 DIAGNOSIS — I639 Cerebral infarction, unspecified: Secondary | ICD-10-CM

## 2024-06-07 MED ORDER — INCLISIRAN SODIUM 284 MG/1.5ML ~~LOC~~ SOSY
284.0000 mg | PREFILLED_SYRINGE | Freq: Once | SUBCUTANEOUS | Status: AC
  Administered 2024-06-07: 284 mg via SUBCUTANEOUS
  Filled 2024-06-07: qty 1.5

## 2024-06-07 MED ORDER — INCLISIRAN SODIUM 284 MG/1.5ML ~~LOC~~ SOSY: 284.0000 mg | PREFILLED_SYRINGE | SUBCUTANEOUS | Status: DC

## 2024-06-07 MED ORDER — INCLISIRAN SODIUM 284 MG/1.5ML ~~LOC~~ SOSY
284.0000 mg | PREFILLED_SYRINGE | Freq: Once | SUBCUTANEOUS | Status: DC
Start: 2024-09-09 — End: 2024-06-08

## 2024-06-07 NOTE — Progress Notes (Addendum)
 Leqvio  subcutaneous injection into patient to left abdomen area  per order. Patient was educated to monitor for signs of allergic /adverse reactions to medicine and to call  medical doctor  if needed. Patient verbalized understanding.  Patient was also scheduled for future three month injection in Nov 04/2024 per patient request.

## 2024-06-07 NOTE — Patient Outreach (Signed)
 Stroke Discharge Follow-up   06/07/2024 Name:  Sara Jackson MRN:  [id number] DOB:  16-Aug-1941  Subjective: Sara Jackson is a 83 y.o. year old female who is a primary care patient of [place] HERO, MD An Emmi alert was received indicating patient responded to questions: Feeling worse overall?. I reached out by phone to follow up on the alert and spoke to Patient.  Care Management Interventions: Patient reports she feels fine, states this was answered incorrectly, no concerns reported SDOH screenings completed Reviewed medications and importance of taking as prescribed  Follow up plan: No further intervention required.   Mliss Creed [facility], BSN RN Care Manager/ Transition of Care [place]/ [facility] [phone]

## 2024-06-14 ENCOUNTER — Telehealth: Payer: Self-pay | Admitting: *Deleted

## 2024-06-14 ENCOUNTER — Telehealth: Payer: Self-pay

## 2024-06-14 DIAGNOSIS — I639 Cerebral infarction, unspecified: Secondary | ICD-10-CM

## 2024-06-14 NOTE — Patient Outreach (Signed)
 Stroke Discharge Follow-up   06/14/2024 Name:  Sara Jackson MRN:  [id number] DOB:  06-28-1941  Subjective: Sara Jackson is a 83 y.o. year old female who is a primary care patient of [place] HERO, MD An Emmi alert was received indicating patient responded to questions: Went to follow-up appointment?. I reached out by phone to follow up on the alert and spoke to Patient.  Care Management Interventions: Verified with pt she has scheduled appointment to  see primary care provider on 06/15/24. SDOH screeenings completed Reviewed medications and importance of taking as prescribed  Follow up plan: No further intervention required.   Mliss Creed [facility], BSN RN Care Manager/ Transition of Care [place]/ [facility] [phone]

## 2024-06-15 ENCOUNTER — Telehealth: Payer: Self-pay

## 2024-06-15 NOTE — Progress Notes (Signed)
 Complex Care Management Note Care Guide Note  06/15/2024 Name: Sara Jackson MRN: [id number] DOB: 06-06-1941   Complex Care Management Outreach Attempts: An unsuccessful telephone outreach was attempted today to offer the patient information about available complex care management services.  Follow Up Plan:  No further outreach attempts will be made at this time. We have been unable to contact the patient to offer or enroll patient in complex care management services.  Encounter Outcome:  Patient Refused  Leotis Rase [facility], [facility] Guide  Direct Dial: [phone]  Fax [phone]

## 2024-07-04 ENCOUNTER — Telehealth: Payer: Self-pay | Admitting: Cardiovascular Disease

## 2024-07-04 DIAGNOSIS — I455 Other specified heart block: Secondary | ICD-10-CM

## 2024-07-04 NOTE — Telephone Encounter (Signed)
 [place] called in to report abnormal heart monitor results. Urgent EKG results were as follows:  07/03/24 at 12:18 pm SR & SB with 1st degree AVB PAC & Bundle PAC's Non conductive P waves 3.0 second pause  HR 54  Read back all information and it was confirmed. Will forward to provider who ordered monitor.

## 2024-07-04 NOTE — Telephone Encounter (Signed)
 [place] calling to report urgent results

## 2024-07-04 NOTE — Telephone Encounter (Signed)
 Gave [facility] mobile cardiac outpatient telemetry to [employer]

## 2024-07-04 NOTE — Telephone Encounter (Signed)
 Patient and daughter report that she has been experiencing dizziness and had some indigestion. Although she reports the indigestion was from eating a hot dog. No other symptoms noted.   Rhythm strip report was given to Dr. Gollan by Summer RN

## 2024-07-04 NOTE — Telephone Encounter (Signed)
 Monitor results given to Dr. Gollan for review. No new orders received at this time.

## 2024-07-05 NOTE — Telephone Encounter (Signed)
 EP referral placed as requested   Furth, Cadence H, PA-C to Dasie Sharlet RAMAN, RN  Estelle Olam GRADE, RN      07/04/24  2:57 PM     07/03/24 at 12:18 pm SR & SB with 1st degree AVB PAC & Bundle PAC's Non conductive P waves 3.0 second pause  HR 54   It looks like the pause was during the day. She is not on any BB/CCB. Can we call and ask her if she had any symptoms at that time. Can we also call and request the EKG/results be faxed over? Lets place an EP referral

## 2024-07-10 ENCOUNTER — Ambulatory Visit: Attending: Medical

## 2024-07-10 DIAGNOSIS — R42 Dizziness and giddiness: Secondary | ICD-10-CM

## 2024-07-10 DIAGNOSIS — I639 Cerebral infarction, unspecified: Secondary | ICD-10-CM

## 2024-07-10 DIAGNOSIS — I6389 Other cerebral infarction: Secondary | ICD-10-CM

## 2024-07-27 ENCOUNTER — Ambulatory Visit (INDEPENDENT_AMBULATORY_CARE_PROVIDER_SITE_OTHER): Payer: Self-pay | Admitting: Diagnostic Neuroimaging

## 2024-07-27 ENCOUNTER — Encounter: Payer: Self-pay | Admitting: Diagnostic Neuroimaging

## 2024-07-27 VITALS — BP 153/81 | HR 80 | Ht 65.0 in | Wt 130.0 lb

## 2024-07-27 DIAGNOSIS — I639 Cerebral infarction, unspecified: Secondary | ICD-10-CM | POA: Diagnosis not present

## 2024-07-27 NOTE — Patient Instructions (Signed)
-   continue aspirin  81, leqvio  injections - follow up with BP

## 2024-07-27 NOTE — Progress Notes (Signed)
 [facility]  PATIENT: Sara Jackson DOB: 07/31/1941  REFERRING CLINICIAN: No ref. provider found HISTORY FROM: patient  REASON FOR VISIT: new consult   HISTORICAL  CHIEF COMPLAINT:  Chief Complaint  Patient presents with   Cerebrovascular Accident    Rm 7 with daughter  Pt is well, reports on going lightheadedness and headaches. Aphasia and L sided weakness has resolved.     HISTORY OF PRESENT ILLNESS:   83 year old female with history of hypertension, here for evaluation of stroke Hospital follow-up.  Patient was having some intermittent dizziness and gait difficulty spells over the last few months.  She went to PCP due to orthostatic hypotension and her blood pressure medications were reduced.  A couple weeks later on 05/28/2024 she presented to the hospital with left-sided weakness, aphasia and headache.  She was found to have blood pressure 230/110.  She was admitted to the hospital for evaluation.  She was given TNK under stroke protocol.  Her symptoms improved over the next several hours.  Stroke workup was completed.  She was discharged home on 05/30/2024.  Since that time patient has had no recurrence of stroke symptoms.  However she continues to have generalized lightheadedness and dizziness when sitting down, which worsens when stands up.  She has been monitoring her blood pressure readings at home.  Sometimes if her systolic blood pressure goes below 100 she feels more dizzy.  She is planning to follow-up with PCP and cardiology regarding this.  Has some generalized postural action tremor since her age 53s.  Had family history of tremor in mother and sister.   REVIEW OF SYSTEMS: Full 14 system review of systems performed and negative with exception of: As per HPI.  ALLERGIES: Allergies  Allergen Reactions   Azo [Phenazopyridine] Itching   Colesevelam Diarrhea   Colesevelam Hcl Diarrhea   Doxycycline Hyclate Other (See Comments)    Other  Reaction: OTHER REACTION   Ezetimibe Diarrhea   Fluoxetine Other (See Comments)    Other Reaction: INSOMNIA   Other Other (See Comments)    Other Reaction: GI UPSET   Statins Diarrhea   Ace Inhibitors Rash    Other Reaction: INSOMNIA   Doxycycline Rash    Other Reaction: OTHER REACTION     Nsaids Rash    Other Reaction: GI UPSET   Risedronate Sodium Rash    Other Reaction: OTHER REACTION      HOME MEDICATIONS: Outpatient Medications Prior to Visit  Medication Sig Dispense Refill   acetaminophen  (TYLENOL ) 325 MG tablet Take 325 mg by mouth every 6 (six) hours as needed.     aspirin  81 MG chewable tablet Chew 1 tablet (81 mg total) by mouth daily. 30 tablet 12   calcium  carbonate (CALCIUM  600) 600 MG TABS tablet Take 600 mg by mouth daily with breakfast.     calcium  carbonate (TUMS - DOSED IN MG ELEMENTAL CALCIUM ) 500 MG chewable tablet Chew 1 tablet (200 mg of elemental calcium  total) by mouth 3 times/day as needed-between meals & bedtime for indigestion or heartburn. 30 tablet 0   Cholecalciferol  (VITAMIN D3) 50 MCG (2000 UT) capsule Take 2,000 Units by mouth daily.     inclisiran (LEQVIO ) 284 MG/1.5ML SOSY injection Inject 284 mg into the skin once.     latanoprost  (XALATAN ) 0.005 % ophthalmic solution Place 1 drop into both eyes at bedtime.     lisinopril  (ZESTRIL ) 5 MG tablet Take 1 tablet (5 mg total) by mouth in the morning and at  bedtime. 180 tablet 3   Multiple Vitamins-Minerals (CENTRUM SILVER ) tablet Take 1 tablet by mouth daily. 30 tablet 0   Multiple Vitamins-Minerals (CERTAVITE SENIOR/ANTIOXIDANT PO) Take 1 tablet by mouth daily.     pantoprazole  (PROTONIX ) 40 MG tablet Take 40 mg by mouth 2 (two) times daily.     clopidogrel  (PLAVIX ) 75 MG tablet Take 1 tablet (75 mg total) by mouth daily. 21 tablet 0   meclizine  (ANTIVERT ) 25 MG tablet Take 25 mg by mouth 3 (three) times daily as needed for dizziness. (Patient not taking: Reported on 06/14/2024)     No  facility-administered medications prior to visit.    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   Vaginal atrophy     PAST SURGICAL HISTORY: Past Surgical History:  Procedure Laterality Date   BIOPSY  07/28/2023   Procedure: BIOPSY;  Surgeon: Unk Corinn Skiff, MD;  Location: [facility] ENDOSCOPY;  Service: Gastroenterology;;   BREAST BIOPSY Left    CATARACT EXTRACTION W/PHACO Right 08/03/2022   Procedure: CATARACT EXTRACTION PHACO AND INTRAOCULAR LENS PLACEMENT (IOC) RIGHT CLARION VIVITY TORIC  LENS  4.49 00:31.4;  Surgeon: Myrna Adine Anes, MD;  Location: [facility]ERY [facility];  Service: Ophthalmology;  Laterality: Right;   CATARACT EXTRACTION W/PHACO Left 08/17/2022   Procedure: CATARACT EXTRACTION PHACO AND INTRAOCULAR LENS PLACEMENT (IOC) LEFT CLARION VIVITY TORIC LENS HYDRUS MICROSTENT;  Surgeon: Myrna Adine Anes, MD;  Location: [facility]ERY [facility];  Service: Ophthalmology;  Laterality: Left;  4.49 0:37.0   ESOPHAGOGASTRODUODENOSCOPY (EGD) WITH PROPOFOL  N/A 03/12/2016   Procedure: ESOPHAGOGASTRODUODENOSCOPY (EGD) WITH PROPOFOL  with dilation;  Surgeon: Rogelia Copping, MD;  Location: [facility];  Service: Endoscopy;  Laterality: N/A;   ESOPHAGOGASTRODUODENOSCOPY (EGD) WITH PROPOFOL  N/A 07/28/2023   Procedure: ESOPHAGOGASTRODUODENOSCOPY (EGD) WITH PROPOFOL ;  Surgeon: Unk Corinn Skiff, MD;  Location: [facility] ENDOSCOPY;  Service: Gastroenterology;   Laterality: N/A;   MELANOMA EXCISION     RADICAL HYSTERECTOMY      FAMILY HISTORY: Family History  Problem Relation Age of Onset   Cancer Mother    Cerebral aneurysm Father    COPD Half-Brother    Dementia Half-Sister    Kidney cancer Neg Hx    Bladder Cancer Neg Hx     SOCIAL HISTORY: Social History   Socioeconomic History   Marital status: Widowed    Spouse name: Not on file   Number of children: Not on file   Years of education: Not on file   Highest education level: Not on file  Occupational History   Not on file  Tobacco Use   Smoking status: Never   Smokeless tobacco: Never  Vaping Use   Vaping status: Never Used  Substance and Sexual Activity   Alcohol use: No    Alcohol/week: 0.0 standard drinks of alcohol   Drug use: No   Sexual activity: Not on file  Other Topics Concern   Not on file  Social History Narrative  Not on file   Social Drivers of Health   Financial Resource Strain: Patient Declined (04/04/2024)   Received from [facility]   Overall Financial Resource Strain (CARDIA)    Difficulty of Paying Living Expenses: Patient declined  Food Insecurity: No Food Insecurity (06/14/2024)   Hunger Vital Sign    Worried About Running Out of Food in the Last Year: Never true    Ran Out of Food in the Last Year: Never true  Transportation Needs: No Transportation Needs (06/14/2024)   PRAPARE - Administrator, Civil Service (Medical): No    Lack of Transportation (Non-Medical): No  Physical Activity: Not on file  Stress: Not on file  Social Connections: Unknown (05/28/2024)   Social Connection and Isolation Panel    Frequency of Communication with Friends and Family: Once a week    Frequency of Social Gatherings with Friends and Family: Once a week    Attends Religious Services: 1 to 4 times per year    Active Member of [employer] or Organizations: No    Attends Banker Meetings: 1 to 4 times per year    Marital  Status: Patient declined  Intimate Partner Violence: Not At Risk (06/14/2024)   Humiliation, Afraid, Rape, and Kick questionnaire    Fear of Current or Ex-Partner: No    Emotionally Abused: No    Physically Abused: No    Sexually Abused: No     PHYSICAL EXAM  GENERAL EXAM/CONSTITUTIONAL: Vitals:  Vitals:   07/27/24 1449 07/27/24 1450  BP: (!) 160/80 (!) 153/81  Pulse: 80   Weight: 130 lb (59 kg)   Height: 5' 5 (1.651 m)    Body mass index is 21.63 kg/m. Wt Readings from Last 3 Encounters:  07/27/24 130 lb (59 kg)  06/02/24 135 lb 12.8 oz (61.6 kg)  05/28/24 134 lb 7.7 oz (61 kg)   Patient is in no distress; well developed, nourished and groomed; neck is supple  CARDIOVASCULAR: Examination of carotid arteries is normal; no carotid bruits Regular rate and rhythm, no murmurs Examination of peripheral vascular system by observation and palpation is normal  EYES: Ophthalmoscopic exam of optic discs and posterior segments is normal; no papilledema or hemorrhages No results found.  MUSCULOSKELETAL: Gait, strength, tone, movements noted in Neurologic exam below  NEUROLOGIC: MENTAL STATUS:      No data to display         awake, alert, oriented to person, place and time recent and remote memory intact normal attention and concentration language fluent, comprehension intact, naming intact fund of knowledge appropriate  CRANIAL NERVE:  2nd - no papilledema on fundoscopic exam 2nd, 3rd, 4th, 6th - pupils equal and reactive to light, visual fields full to confrontation, extraocular muscles intact, no nystagmus 5th - facial sensation symmetric 7th - facial strength symmetric 8th - hearing intact 9th - palate elevates symmetrically, uvula midline 11th - shoulder shrug symmetric 12th - tongue protrusion midline  MOTOR:  POSTURAL / ACTION TREMOR IN BUE normal bulk and tone, full strength in the BUE, BLE  SENSORY:  normal and symmetric to light touch, temperature,  vibration  COORDINATION:  finger-nose-finger, fine finger movements normal  REFLEXES:  deep tendon reflexes TRACE and symmetric  GAIT/STATION:  narrow based gait     DIAGNOSTIC DATA (LABS, IMAGING, TESTING) - I reviewed patient records, labs, notes, testing and imaging myself where available.  Lab Results  Component Value Date   WBC 8.3 05/30/2024   HGB  11.9 (L) 05/30/2024   HCT 36.1 05/30/2024   MCV 94.3 05/30/2024   PLT 311 05/30/2024      Component Value Date/Time   NA 140 05/30/2024 1027   K 4.4 05/30/2024 1027   CL 101 05/30/2024 1027   CO2 30 05/30/2024 1027   GLUCOSE 96 05/30/2024 1027   BUN 15 05/30/2024 1027   CREATININE 1.14 (H) 05/30/2024 1027   CALCIUM  8.8 (L) 05/30/2024 1027   PROT 7.7 05/28/2024 1743   ALBUMIN 4.0 05/28/2024 1743   AST 33 05/28/2024 1743   ALT 23 05/28/2024 1743   ALKPHOS 66 05/28/2024 1743   BILITOT 0.6 05/28/2024 1743   GFRNONAA 47 (L) 05/30/2024 1027   Lab Results  Component Value Date   CHOL 199 05/29/2024   HDL 80 05/29/2024   LDLCALC 103 (H) 05/29/2024   TRIG 80 05/29/2024   CHOLHDL 2.5 05/29/2024   Lab Results  Component Value Date   HGBA1C 4.8 05/28/2024   No results found for: VITAMINB12 No results found for: TSH  08/24/17 MRI lumbar spine 1. At L4-5 there is a mild broad-based disc bulge eccentric towards the left. Moderate bilateral facet arthropathy. Bilateral lateral recess narrowing, left greater than right. Mild spinal stenosis. Mild left foraminal stenosis.  05/29/24 MRI brain  - No acute infarct or other significant abnormality   05/28/24 CTA head / neck  1. No large vessel occlusion. 2. 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 3. Cervical carotid atherosclerosis without significant stenosis. 4.  Aortic Atherosclerosis (ICD10-I70.0).  05/29/24 TTE  1. Left ventricular ejection fraction, by estimation, is 65 to 70%. The  left ventricle has normal  function. The left ventricle has no regional  wall motion abnormalities. Left ventricular diastolic parameters are  consistent with Grade I diastolic  dysfunction (impaired relaxation).   2. Right ventricular systolic function is normal. The right ventricular  size is normal. There is normal pulmonary artery systolic pressure. The  estimated right ventricular systolic pressure is 29.4 mmHg.   3. The mitral valve is grossly normal. Trivial mitral valve  regurgitation. No evidence of mitral stenosis.   4. The aortic valve was not well visualized. Aortic valve regurgitation  is not visualized. No aortic stenosis is present.   5. The inferior vena cava is normal in size with greater than 50%  respiratory variability, suggesting right atrial pressure of 3 mmHg.   6. Agitated saline contrast bubble study was negative, with no evidence  of any interatrial shunt.     ASSESSMENT AND PLAN  83 y.o. year old female here with:  Dx:  1. Cerebrovascular accident (CVA), unspecified mechanism (HCC)     PLAN:  83 y.o. left-handed patient with history of hypertension, chronic diarrhea, BPPV, hyperlipidemia and GERD was admitted with headache, aphasia and left-sided weakness.   Stroke event --> TNK --> MRI DWI negative Etiology: Uncertain at this time (possible hypertensive emergency event) Code Stroke CT head No acute abnormality. ASPECTS 10.    CTA head & neck no LVO, intracranial atherosclerosis including severe right and moderate left P2 stenoses MRI negative for acute infarct 24-hour follow-up CT pending 2D Echo EF 65 to 70%, grade 1 diastolic dysfunction, no interatrial shunt, normal left atrial size LDL 103 HgbA1c 4.8 No antithrombotic prior to admission, now on aspirin  81 mg and Plavix  75 mg daily into 3 weeks and then now aspirin  81mg  alone   Hypertension Continue lisinopril  5mg  twice a day Blood Pressure Goal: SBP 100-120  Hyperlipidemia LDL 103, goal < 70 High intensity statin  not indicated due to previous intolerance Continue Leqvio    Return for return to PCP, pending if symptoms worsen or fail to improve.    EDUARD FABIENE HANLON, MD 07/27/2024, 7:49 PM Certified in Neurology, Neurophysiology and Neuroimaging  [facility] [address] [place], [place] [address] [phone]

## 2024-08-01 DIAGNOSIS — I639 Cerebral infarction, unspecified: Secondary | ICD-10-CM

## 2024-08-01 DIAGNOSIS — R42 Dizziness and giddiness: Secondary | ICD-10-CM

## 2024-08-01 DIAGNOSIS — I6389 Other cerebral infarction: Secondary | ICD-10-CM | POA: Diagnosis not present

## 2024-08-02 ENCOUNTER — Ambulatory Visit: Payer: Self-pay | Admitting: Medical

## 2024-08-02 ENCOUNTER — Ambulatory Visit: Attending: Cardiology | Admitting: Cardiology

## 2024-08-02 VITALS — BP 150/78 | HR 88 | Ht 65.0 in | Wt 131.0 lb

## 2024-08-02 DIAGNOSIS — R42 Dizziness and giddiness: Secondary | ICD-10-CM | POA: Diagnosis present

## 2024-08-02 DIAGNOSIS — I6389 Other cerebral infarction: Secondary | ICD-10-CM | POA: Insufficient documentation

## 2024-08-02 DIAGNOSIS — I10 Essential (primary) hypertension: Secondary | ICD-10-CM | POA: Diagnosis not present

## 2024-08-02 NOTE — Patient Instructions (Signed)
 Medication Instructions:  Your physician recommends that you continue on your current medications as directed. Please refer to the Current Medication list given to you today.  *If you need a refill on your cardiac medications before your next appointment, please call your pharmacy*  Testing/Procedures: Loop Recorder Implant - this will be done at your next office visit. There are no restrictions or special instructions prior to this visit. Please note that you will not be able to shower for 72 hours after the procedure.   Follow-Up: At [facility], you and your health needs are our priority.  As part of our continuing mission to provide you with exceptional heart care, our providers are all part of one team.  This team includes your primary Cardiologist (physician) and Advanced Practice Providers or APPs (Physician Assistants and Nurse Practitioners) who all work together to provide you with the care you need, when you need it.  Your next appointment:

## 2024-08-02 NOTE — Progress Notes (Signed)
 [facility] Office Note:    Date:  08/02/2024   ID:  Sara Jackson, DOB 1941/05/26, MRN [id number]  [facility] Cardiologist:  Evalene Lunger, MD  [facility] Electrophysiologist:  OLE ONEIDA HOLTS, MD   Referring MD: Franchester Mikey DEL, PA-C   Chief Complaint: Cryptogenic stroke  History of Present Illness:    Sara Jackson is an 83 year old woman who I am seeing today for an evaluation of cryptogenic stroke at the request of Cadence Furth, PA-C.  The patient has a history of hypertension, GERD, presyncope and prior stroke.  She was admitted July 27 through July 29 to the stroke service at [facility]  She received TNK.  She was discharged on aspirin  and Plavix  for 3 weeks followed by aspirin  monotherapy.  No prior history of atrial fibrillation.      Their past medical, social and family history was reviewed.   ROS:   Please see the history of present illness.    All other systems reviewed and are negative.  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  Narrow QRS.      Physical Exam:    VS:  BP (!) 150/78   Pulse 88   Ht 5' 5 (1.651 m)   Wt 131 lb (59.4 kg)   SpO2 94%   BMI 21.80 kg/m     Wt Readings from Last 3 Encounters:  08/02/24 131 lb (59.4 kg)  07/27/24 130 lb (59 kg)  06/02/24 135 lb 12.8 oz (61.6 kg)     GEN: no distress CARD: RRR, No MRG RESP: No IWOB. CTAB.        ASSESSMENT AND PLAN:    1. Cerebrovascular accident (CVA) due to other mechanism (HCC)   2. Dizziness   3. Primary hypertension     #Cryptogenic stroke No history of atrial fibrillation.  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  I did discuss the role of loop recorder monitoring in patients with a history of cryptogenic stroke.  I discussed the loop  recorder procedure in detail including the risks and monitoring costs.  She would like to proceed.  We will bring her back for this on another day.  #Hypertension Above goal today.  She brings records from home showing well-controlled blood pressures.  Recommend checking blood pressures 1-2 times per week at home and recording the values.  Recommend bringing these recordings to the primary care physician.     Signed, OLE ONEIDA. HOLTS, MD, [facility], [facility] 08/02/2024 2:03 PM    Electrophysiology [facility]

## 2024-08-03 ENCOUNTER — Ambulatory Visit: Admitting: Medical

## 2024-08-09 ENCOUNTER — Telehealth: Payer: Self-pay | Admitting: Cardiovascular Disease

## 2024-08-09 NOTE — Telephone Encounter (Signed)
 Called and spoke with daughter per DPR. Patient would like to know if she needs a lipid panel drawn prior to her upcoming appointment with Dr. Gollan. Will forward to provider for recommendations.

## 2024-08-09 NOTE — Telephone Encounter (Signed)
 Pt would like to know if she will have blood work done before her next appt to check cholesterol levels. Please advise.

## 2024-08-10 ENCOUNTER — Telehealth: Payer: Self-pay | Admitting: *Deleted

## 2024-08-10 NOTE — Telephone Encounter (Signed)
 Spoke to daughter Per Dr Margaret please return to PCP to manage leqivo . Daughter states patient will return back to pcp and have lipids redrawn to see if medication is still needed . Daughter thanked me for calling

## 2024-08-11 NOTE — Telephone Encounter (Signed)
 Called daughter per DPR. Left detailed message that patient does not need labs prior to appointment.

## 2024-08-19 NOTE — Progress Notes (Unsigned)
 Cardiology Office Note  Date:  08/21/2024   ID:  Sara Jackson, DOB 05/25/1941, MRN [id number]  PCP:  Buren Rock HERO, MD   Chief Complaint  Patient presents with   2 month follow up     Discuss cardiac event monitor. Patient c/o chest pain/indigestion at times.     HPI:  [name] is a 83 year old woman with past medical history of Degenerative disc disease lumbar spine Hypertension Who presents by referral from [facility] for consultation of near syncope, cryptogenic stroke  Last seen in clinic 4/23 Last seen by EP October 2025  admitted May 28, 2024 through July 29 to the stroke service at [facility].  Reported having difficulty with her speech, she received TNK. She was discharged on aspirin  and Plavix  for 3 weeks followed by aspirin  monotherapy.  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  CT scan brain results reviewed with her in detail detailing atherosclerosis  Reports statin intolerance, had myalgias On leqvio , had one shot to date done at [facility] through same-day surgery  Worried about veins in her arms and legs, they seem dark, sometimes protruding, superficial varicosities  Reports that she gave up driving after the stroke Daughter who presents with her does all the driving Currently living with her daughter  Event monitor reviewed in detail, 30-day Normal sinus rhythm No atrial fibrillation Pauses > 2 seconds occurred 1 time( s) with longest pause 3. 0 seconds  Heart Block occurred 3 time( s) the most severe 2 type 1;  slowest 38 BPM (8:02 AM) 4,018 PACs with PAC burden of < 1%  4,505 PVCs with PVC burden of < 1%   24 events were transmitted. 5 patient triggered; 19 auto triggered. Patient triggered events associated with normal sinus rhythm  Rare dizziness, occasional orthostatic numbers at home Will typically sit until symptoms resolved Most pressures systolic 110 up to 140  Takes lisinopril  5 twice daily  CT chest 2020 with coronary  calcifications  EKG personally reviewed by myself on todays visit EKG Interpretation Date/Time:  Monday August 21 2024 09:55:04 EDT Ventricular Rate:  79 PR Interval:  150 QRS Duration:  80 QT Interval:  386 QTC Calculation: 442 R Axis:   63  Text Interpretation: Normal sinus rhythm Normal ECG When compared with ECG of 29-May-2024 15:43, No significant change was found Confirmed by Perla Lye [phone]) on 08/21/2024 10:23:19 AM    PMH:   has a past medical history of Abdominal pain, AP (abdominal pain) (07/04/2015), Arthritis, Benign paroxysmal positional nystagmus (07/11/2014), Bladder pain, Bursitis, trochanteric (05/02/2014), Chronic interstitial cystitis, Cystocele, Cystocele, midline (07/04/2015), Essential (primary) hypertension (07/11/2014), GERD (gastroesophageal reflux disease), Glaucoma (07/11/2014), Hypercholesteremia (07/11/2014), Impingement syndrome of shoulder (10/18/2014), LBP (low back pain) (05/02/2014), 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  PSH:    Past Surgical History:  Procedure Laterality Date   BIOPSY  07/28/2023   Procedure: BIOPSY;  Surgeon: Unk Corinn Skiff, MD;  Location[facility] ENDOSCOPY;  Service: Gastroenterology;;   BREAST BIOPSY Left    CATARACT EXTRACTION W/PHACO Right 08/03/2022   Procedure: CATARACT EXTRACTION PHACO AND INTRAOCULAR LENS PLACEMENT (IOC) RIGHT CLARION VIVITY TORIC  LENS  4.49 00:31.4;  Surgeon: Myrna Adine Anes, MD;  Location: [facility];  Service: Ophthalmology;  Laterality: Right;   CATARACT EXTRACTION W/PHACO Left 08/17/2022   Procedure: CATARACT EXTRACTION PHACO AND INTRAOCULAR LENS PLACEMENT (IOC) LEFT CLARION VIVITY TORIC LENS HYDRUS MICROSTENT;  Surgeon: Myrna Adine Anes, MD;  Location: [facility] SUR[facility]ERY [facility];  Service: Ophthalmology;  Laterality: Left;  4.49 0:37.0   ESOPHAGOGASTRODUODENOSCOPY (EGD) WITH PROPOFOL  N/A 03/12/2016   Procedure: ESOPHAGOGASTRODUODENOSCOPY (EGD) WITH PROPOFOL  with dilation;   Surgeon: Rogelia Copping, MD;  Location: [facility];  Service: Endoscopy;  Laterality: N/A;   ESOPHAGOGASTRODUODENOSCOPY (EGD) WITH PROPOFOL  N/A 07/28/2023   Procedure: ESOPHAGOGASTRODUODENOSCOPY (EGD) WITH PROPOFOL ;  Surgeon: Unk Corinn Skiff, MD;  Location: [facility] ENDOSCOPY;  Service: Gastroenterology;  Laterality: N/A;   MELANOMA EXCISION     RADICAL HYSTERECTOMY      Current Outpatient Medications  Medication Sig Dispense Refill   acetaminophen  (TYLENOL ) 325 MG tablet Take 325 mg by mouth every 6 (six) hours as needed.     aspirin  81 MG chewable tablet Chew 1 tablet (81 mg total) by mouth daily. 30 tablet 12   calcium  carbonate (CALCIUM  600) 600 MG TABS tablet Take 600 mg by mouth daily with breakfast.     calcium  carbonate (TUMS - DOSED IN MG ELEMENTAL CALCIUM ) 500 MG chewable tablet Chew 1 tablet (200 mg of elemental calcium  total) by mouth 3 times/day as needed-between meals & bedtime for indigestion or heartburn. 30 tablet 0   Cholecalciferol  (VITAMIN D3) 50 MCG (2000 UT) capsule Take 2,000 Units by mouth daily.     inclisiran (LEQVIO ) 284 MG/1.5ML SOSY injection Inject 284 mg into the skin once.     latanoprost  (XALATAN ) 0.005 % ophthalmic solution Place 1 drop into both eyes at bedtime.     lisinopril  (ZESTRIL ) 5 MG tablet Take 1 tablet (5 mg total) by mouth in the morning and at bedtime. 180 tablet 3   Multiple Vitamins-Minerals (CENTRUM SILVER ) tablet Take 1 tablet by mouth daily. 30 tablet 0   Multiple Vitamins-Minerals (CERTAVITE SENIOR/ANTIOXIDANT PO) Take 1 tablet by mouth daily.     pantoprazole  (PROTONIX ) 40 MG tablet Take 40 mg by mouth 2 (two) times daily.     No current facility-administered medications for this visit.    Allergies:   Azo [phenazopyridine], Colesevelam, Colesevelam hcl, Doxycycline hyclate, Ezetimibe, Fluoxetine, Lansoprazole, Other, Risedronate, Statins, Ace inhibitors, Doxycycline, Nsaids, and Risedronate sodium   Social History:  The patient   reports that she has never smoked. She has never used smokeless tobacco. She reports that she does not drink alcohol and does not use drugs.   Family History:   family history includes COPD in her half-brother; Cancer in her mother; Cerebral aneurysm in her father; Dementia in her half-sister.    Review of Systems: Review of Systems  Constitutional: Negative.   HENT: Negative.    Respiratory: Negative.    Cardiovascular: Negative.   Gastrointestinal: Negative.   Musculoskeletal: Negative.   Neurological:  Positive for dizziness.  Psychiatric/Behavioral: Negative.    All other systems reviewed and are negative.    PHYSICAL EXAM: VS:  BP 130/70 (BP Location: Left Arm, Patient Position: Sitting, Cuff Size: Normal)   Pulse 79   Ht 5' 5 (1.651 m)   Wt 129 lb 8 oz (58.7 kg)   SpO2 96%   BMI 21.55 kg/m  , BMI Body mass index is 21.55 kg/m. Constitutional:  oriented to person, place, and time. No distress.  HENT:  Head: Grossly normal Eyes:  no discharge. No scleral icterus.  Neck: No JVD, no carotid bruits  Cardiovascular: Regular rate and rhythm, no murmurs appreciated Pulmonary/Chest: Clear to auscultation bilaterally, no wheezes or rails Abdominal: Soft.  no distension.  no tenderness.  Musculoskeletal: Normal range of motion Neurological:  normal muscle tone. Coordination normal. No atrophy Skin: Skin warm and dry  Psychiatric: normal affect, pleasant  Recent Labs: 04/27/2024: Magnesium 2.0 05/28/2024: ALT 23 05/30/2024: BUN 15; Creatinine, Ser 1.14; Hemoglobin 11.9; Platelets 311; Potassium 4.4; Sodium 140    Lipid Panel Lab Results  Component Value Date   CHOL 199 05/29/2024   HDL 80 05/29/2024   LDLCALC 103 (H) 05/29/2024   TRIG 80 05/29/2024      Wt Readings from Last 3 Encounters:  08/21/24 129 lb 8 oz (58.7 kg)  08/02/24 131 lb (59.4 kg)  07/27/24 130 lb (59 kg)     ASSESSMENT AND PLAN:  Problem List Items Addressed This Visit       Cardiology  Problems   Stroke (cerebrum) (HCC) - Primary   Relevant Orders   EKG 12-Lead (Completed)     Other   Dizziness   Other Visit Diagnoses       Primary hypertension         Sinus pause       Relevant Orders   EKG 12-Lead (Completed)     Essential hypertension       Relevant Orders   EKG 12-Lead (Completed)     Orthostasis         Hyperlipidemia, mixed           Near syncope Long history of near syncope, previously presenting when bending and walking Would resolve after several minutes Denies syncope -Rare orthostatic numbers at home, in general doing well with reasonable balance -30-day monitor with no significant arrhythmia  History of stroke Declining loop monitor Seen by EP On aspirin  81, Leqvio   Essential hypertension Recommend she move lisinopril  5 to lunchtime, additional 5 dinnertime For the most part blood pressure stable 110 up to 140 systolic, rare low pressure Staying hydrated  Coronary calcification On aspirin  with Leqvio   Hyperlipidemia Reports she has tried statins, Zetia, has intolerance Previously declined PCSK9 inhibitor Tolerating Leqvio     Signed, Velinda Lunger, M.D., Ph.D. [facility] [place], [place] [phone]

## 2024-08-21 ENCOUNTER — Encounter: Payer: Self-pay | Admitting: Cardiovascular Disease

## 2024-08-21 ENCOUNTER — Ambulatory Visit: Attending: Cardiovascular Disease | Admitting: Cardiovascular Disease

## 2024-08-21 VITALS — BP 130/70 | HR 79 | Ht 65.0 in | Wt 129.5 lb

## 2024-08-21 DIAGNOSIS — T466X5A Adverse effect of antihyperlipidemic and antiarteriosclerotic drugs, initial encounter: Secondary | ICD-10-CM | POA: Insufficient documentation

## 2024-08-21 DIAGNOSIS — M791 Myalgia, unspecified site: Secondary | ICD-10-CM | POA: Insufficient documentation

## 2024-08-21 DIAGNOSIS — I951 Orthostatic hypotension: Secondary | ICD-10-CM | POA: Diagnosis present

## 2024-08-21 DIAGNOSIS — E782 Mixed hyperlipidemia: Secondary | ICD-10-CM | POA: Insufficient documentation

## 2024-08-21 DIAGNOSIS — R42 Dizziness and giddiness: Secondary | ICD-10-CM | POA: Diagnosis not present

## 2024-08-21 DIAGNOSIS — I6389 Other cerebral infarction: Secondary | ICD-10-CM | POA: Diagnosis not present

## 2024-08-21 DIAGNOSIS — I455 Other specified heart block: Secondary | ICD-10-CM | POA: Insufficient documentation

## 2024-08-21 DIAGNOSIS — I10 Essential (primary) hypertension: Secondary | ICD-10-CM | POA: Insufficient documentation

## 2024-08-21 DIAGNOSIS — T466X5D Adverse effect of antihyperlipidemic and antiarteriosclerotic drugs, subsequent encounter: Secondary | ICD-10-CM

## 2024-08-21 MED ORDER — ASPIRIN 81 MG PO TBEC: 81.0000 mg | DELAYED_RELEASE_TABLET | Freq: Every day | ORAL | 3 refills | Status: AC

## 2024-08-21 NOTE — Patient Instructions (Signed)

## 2024-08-22 ENCOUNTER — Telehealth: Payer: Self-pay

## 2024-08-22 NOTE — Patient Outreach (Signed)
 First telephone outreach attempt to obtain mRS. No answer. Left message for returned call.  [facility]  Population Health Care Management Assistant  Direct Dial: [phone]  Fax: [phone] Website: [email or web address]

## 2024-08-30 ENCOUNTER — Telehealth: Payer: Self-pay

## 2024-08-30 ENCOUNTER — Ambulatory Visit: Admitting: Cardiology

## 2024-08-30 NOTE — Patient Outreach (Signed)
 Telephone outreach to patient to obtain mRS was successfully completed. MRS= 1  Sara Jackson [facility] VBCI Assistant Direct Dial: [phone]  Fax: [phone] Website: [email or web address]

## 2024-09-07 ENCOUNTER — Inpatient Hospital Stay: Admission: RE | Admit: 2024-09-07 | Source: Ambulatory Visit
